# Patient Record
Sex: Male | Born: 1985 | Race: White | Hispanic: No | Marital: Married | Smoking: Never smoker
Health system: Southern US, Community
[De-identification: ages and names within clinical notes are randomized; demographics above are authoritative.]

## PROBLEM LIST (undated history)

## (undated) DIAGNOSIS — H547 Unspecified visual loss: Secondary | ICD-10-CM

## (undated) DIAGNOSIS — E785 Hyperlipidemia, unspecified: Secondary | ICD-10-CM

## (undated) DIAGNOSIS — T7840XA Allergy, unspecified, initial encounter: Secondary | ICD-10-CM

## (undated) DIAGNOSIS — J45909 Unspecified asthma, uncomplicated: Secondary | ICD-10-CM

## (undated) HISTORY — DX: Unspecified visual loss: H54.7

## (undated) HISTORY — PX: SHOULDER SURGERY: SHX246

## (undated) HISTORY — DX: Hyperlipidemia, unspecified: E78.5

## (undated) HISTORY — DX: Hemochromatosis, unspecified: E83.119

## (undated) HISTORY — DX: Unspecified asthma, uncomplicated: J45.909

## (undated) HISTORY — DX: Allergy, unspecified, initial encounter: T78.40XA

---

## 2011-12-02 ENCOUNTER — Encounter: Payer: Self-pay | Admitting: Medical

## 2011-12-02 ENCOUNTER — Ambulatory Visit (INDEPENDENT_AMBULATORY_CARE_PROVIDER_SITE_OTHER): Payer: BC Managed Care – PPO | Admitting: Medical

## 2011-12-02 VITALS — BP 110/78 | HR 60 | Temp 98.1°F | Resp 12 | Ht 72.0 in | Wt 257.0 lb

## 2011-12-02 DIAGNOSIS — Z139 Encounter for screening, unspecified: Secondary | ICD-10-CM

## 2011-12-02 DIAGNOSIS — Z111 Encounter for screening for respiratory tuberculosis: Secondary | ICD-10-CM

## 2011-12-02 DIAGNOSIS — Z0000 Encounter for general adult medical examination without abnormal findings: Secondary | ICD-10-CM

## 2011-12-02 LAB — LIPID PANEL
Cholesterol: 244 mg/dL — ABNORMAL HIGH (ref 0–200)
Total CHOL/HDL Ratio: 5.8 Ratio

## 2011-12-02 LAB — HEPATITIS B SURFACE ANTIBODY,QUALITATIVE

## 2011-12-02 LAB — CBC WITH DIFFERENTIAL/PLATELET
Basophils Relative: 0 % (ref 0–1)
Eosinophils Absolute: 0.1 10*3/uL (ref 0.0–0.7)
Eosinophils Relative: 2 % (ref 0–5)
Lymphs Abs: 1.9 10*3/uL (ref 0.7–4.0)
MCH: 30 pg (ref 26.0–34.0)
MCHC: 34.9 g/dL (ref 30.0–36.0)
MCV: 85.8 fL (ref 78.0–100.0)
Neutrophils Relative %: 54 % (ref 43–77)
Platelets: 239 10*3/uL (ref 150–400)
RBC: 5.07 MIL/uL (ref 4.22–5.81)
RDW: 13.9 % (ref 11.5–15.5)

## 2011-12-02 LAB — COMPREHENSIVE METABOLIC PANEL
ALT: 56 U/L — ABNORMAL HIGH (ref 0–53)
AST: 28 U/L (ref 0–37)
Albumin: 4.9 g/dL (ref 3.5–5.2)
Alkaline Phosphatase: 82 U/L (ref 39–117)
Potassium: 4.5 mEq/L (ref 3.5–5.3)
Sodium: 139 mEq/L (ref 135–145)
Total Bilirubin: 0.8 mg/dL (ref 0.3–1.2)
Total Protein: 7.1 g/dL (ref 6.0–8.3)

## 2011-12-02 LAB — TSH: TSH: 2.162 u[IU]/mL (ref 0.350–4.500)

## 2011-12-02 NOTE — Progress Notes (Signed)
Subjective:   HPI  Michael Lucero is a 26 y.o. male who presents for a complete physical.  New patient today.  Doing well, no particular c/o.  He is a Heritage manager at [employer] and needs form complete for physical.  Otherwise been in good health.    Reviewed their medical, surgical, family, social, medication, and allergy history and updated chart as appropriate.  Preventative care: Sees ophthalmology soon for routine f/u.  Has dentist visit coming up.   Last tdap 2008, has had Hep B series prior.     Past Medical History  Diagnosis Date  . Allergy   . Childhood asthma   . Vision problem     wears contacts    Past Surgical History  Procedure Date  . Shoulder surgery     x 3 surgeries, 2 for labral tear; right shoulder    Family History  Problem Relation Age of Onset  . Diabetes Father   . Heart disease Father     CABG age 63yo  . Cancer Father      neuroendocrine tumor of stomach  . Other Father     died post surgery with organ failure after tumor removal  . Stroke Neg Hx   . Hypertension Neg Hx   . Hyperlipidemia Neg Hx     History   Social History  . Marital Status: Single    Spouse Name: N/A    Number of Children: N/A  . Years of Education: N/A   Occupational History  . football coach [employer]   Social History Main Topics  . Smoking status: Never Smoker   . Smokeless tobacco: Not on file  . Alcohol Use: 7.2 oz/week    6 Cans of beer, 6 Glasses of wine per week  . Drug Use: No  . Sexually Active: Not on file   Other Topics Concern  . Not on file   Social History Narrative   Girlfriend, no children, exercise 4 times per week with running and weight lifting    No current outpatient prescriptions on file prior to visit.    No Known Allergies  Review of Systems Constitutional: -fever, -chills, -sweats, -unexpected weight change, -anorexia, -fatigue Allergy: -sneezing, -itching, -congestion Dermatology: denies changing moles,  rash, lumps, new worrisome lesions ENT: -runny nose, -ear pain, -sore throat, -hoarseness, -sinus pain, -teeth pain, -tinnitus, -hearing loss, -epistaxis Cardiology:  -chest pain, -palpitations, -edema, -orthopnea, -paroxysmal nocturnal dyspnea Respiratory: -cough, -shortness of breath, -dyspnea on exertion, -wheezing, -hemoptysis Gastroenterology: -abdominal pain, -nausea, -vomiting, -diarrhea, -constipation, -blood in stool, -changes in bowel movement, -dysphagia Hematology: -bleeding or bruising problems Musculoskeletal: -arthralgias, -myalgias, -joint swelling, -back pain, -neck pain, -cramping, -gait changes Ophthalmology: -vision changes, -eye redness, -itching, -discharge Urology: -dysuria, -difficulty urinating, -hematuria, -urinary frequency, -urgency, incontinence Neurology: -headache, -weakness, -tingling, -numbness, -speech abnormality, -memory loss, -falls, -dizziness Psychology:  -depressed mood, -agitation, -sleep problems      Objective:   Physical Exam  Filed Vitals:   12/02/11 1019  BP: 110/78  Pulse: 60  Temp: 98.1 F (36.7 C)  Resp: 12    General appearance: alert, no distress, WD/WN, stocky white male Skin: right upper latera thigh with horizontal scar, otherwise few scattered benign appearing macules HEENT: normocephalic, conjunctiva/corneas normal, sclerae anicteric, PERRLA, EOMi, nares patent, no discharge or erythema, pharynx normal Oral cavity: MMM, tongue normal, teeth normal Neck: supple, no lymphadenopathy, no thyromegaly, no masses, normal ROM Chest: non tender, normal shape and expansion Heart: RRR, normal S1, S2, no murmurs Lungs:  CTA bilaterally, no wheezes, rhonchi, or rales Abdomen: +bs, soft, non tender, non distended, no masses, no hepatomegaly, no splenomegaly, no bruits Back: non tender, normal ROM, no scoliosis Musculoskeletal: upper extremities non tender, no obvious deformity, normal ROM throughout, lower extremities non tender, no obvious  deformity, normal ROM throughout Extremities: no edema, no cyanosis, no clubbing Pulses: 2+ symmetric, upper and lower extremities, normal cap refill Neurological: alert, oriented x 3, CN2-12 intact, strength normal upper extremities and lower extremities, sensation normal throughout, DTRs 2+ throughout, no cerebellar signs, gait normal Psychiatric: normal affect, behavior normal, pleasant  GU: normal male external genitalia, nontender, no masses, no hernia, no lymphadenopathy Rectal: deferred   Assessment and Plan :    Encounter Diagnoses  Name Primary?  . Routine general medical examination at a health care facility Yes  . PPD screening test   . Screening for condition     Physical exam - discussed healthy lifestyle, diet, exercise, preventative care, vaccinations, and addressed their concerns.    PPD screening - due to the holiday weekend, he will return next week for PPD screening.  Titers today for Hep B and MMR, and he will get copies of his prior Tdap.   Form completion pending labs.    Follow-up pending labs.

## 2011-12-02 NOTE — Patient Instructions (Signed)
Preventative Care for Adults, Male       REGULAR HEALTH EXAMS:  A routine yearly physical is a good way to check in with your primary care provider about your health and preventive screening. It is also an opportunity to share updates about your health and any concerns you have, and receive a thorough all-over exam.   Most health insurance companies pay for at least some preventative services.  Check with your health plan for specific coverages.  WHAT PREVENTATIVE SERVICES DO MEN NEED?  Adult men should have their weight and blood pressure checked regularly.   Men age 27 and older should have their cholesterol levels checked regularly.  Beginning at age 27 and continuing to age 27, men should be screened for colorectal cancer.  Certain people should may need continued testing until age 85.  Other cancer screening may include exams for testicular and prostate cancer.  Updating vaccinations is part of preventative care.  Vaccinations help protect against diseases such as the flu.  Lab tests are generally done as part of preventative care to screen for anemia and blood disorders, to screen for problems with the kidneys and liver, to screen for bladder problems, to check blood sugar, and to check your cholesterol level.  Preventative services generally include counseling about diet, exercise, avoiding tobacco, drugs, excessive alcohol consumption, and sexually transmitted infections.    GENERAL RECOMMENDATIONS FOR GOOD HEALTH:  Healthy diet:  Eat a variety of foods, including fruit, vegetables, animal or vegetable protein, such as meat, fish, chicken, and eggs, or beans, lentils, tofu, and grains, such as rice.  Drink plenty of water daily.  Decrease saturated fat in the diet, avoid lots of red meat, processed foods, sweets, fast foods, and fried foods.  Exercise:  Aerobic exercise helps maintain good heart health. At least 30-40 minutes of moderate-intensity exercise is recommended.  For example, a brisk walk that increases your heart rate and breathing. This should be done on most days of the week.   Find a type of exercise or a variety of exercises that you enjoy so that it becomes a part of your daily life.  Examples are running, walking, swimming, water aerobics, and biking.  For motivation and support, explore group exercise such as aerobic class, spin class, Zumba, Yoga,or  martial arts, etc.    Set exercise goals for yourself, such as a certain weight goal, walk or run in a race such as a 5k walk/run.  Speak to your primary care provider about exercise goals.  Disease prevention:  If you smoke or chew tobacco, find out from your caregiver how to quit. It can literally save your life, no matter how long you have been a tobacco user. If you do not use tobacco, never begin.   Maintain a healthy diet and normal weight. Increased weight leads to problems with blood pressure and diabetes.   The Body Mass Index or BMI is a way of measuring how much of your body is fat. Having a BMI above 27 increases the risk of heart disease, diabetes, hypertension, stroke and other problems related to obesity. Your caregiver can help determine your BMI and based on it develop an exercise and dietary program to help you achieve or maintain this important measurement at a healthful level.  High blood pressure causes heart and blood vessel problems.  Persistent high blood pressure should be treated with medicine if weight loss and exercise do not work.   Fat and cholesterol leaves deposits in your arteries   that can block them. This causes heart disease and vessel disease elsewhere in your body.  If your cholesterol is found to be high, or if you have heart disease or certain other medical conditions, then you may need to have your cholesterol monitored frequently and be treated with medication.   Ask if you should have a stress test if your history suggests this. A stress test is a test done on  a treadmill that looks for heart disease. This test can find disease prior to there being a problem.  Avoid drinking alcohol in excess (more than two drinks per day).  Avoid use of street drugs. Do not share needles with anyone. Ask for professional help if you need assistance or instructions on stopping the use of alcohol, cigarettes, and/or drugs.  Brush your teeth twice a day with fluoride toothpaste, and floss once a day. Good oral hygiene prevents tooth decay and gum disease. The problems can be painful, unattractive, and can cause other health problems. Visit your dentist for a routine oral and dental check up and preventive care every 6-12 months.   Look at your skin regularly.  Use a mirror to look at your back. Notify your caregivers of changes in moles, especially if there are changes in shapes, colors, a size larger than a pencil eraser, an irregular border, or development of new moles.  Safety:  Use seatbelts 100% of the time, whether driving or as a passenger.  Use safety devices such as hearing protection if you work in environments with loud noise or significant background noise.  Use safety glasses when doing any work that could send debris in to the eyes.  Use a helmet if you ride a bike or motorcycle.  Use appropriate safety gear for contact sports.  Talk to your caregiver about gun safety.  Use sunscreen with a SPF (or skin protection factor) of 15 or greater.  Lighter skinned people are at a greater risk of skin cancer. Don't forget to also wear sunglasses in order to protect your eyes from too much damaging sunlight. Damaging sunlight can accelerate cataract formation.   Practice safe sex. Use condoms. Condoms are used for birth control and to help reduce the spread of sexually transmitted infections (or STIs).  Some of the STIs are gonorrhea (the clap), chlamydia, syphilis, trichomonas, herpes, HPV (human papilloma virus) and HIV (human immunodeficiency virus) which causes AIDS.  The herpes, HIV and HPV are viral illnesses that have no cure. These can result in disability, cancer and death.   Keep carbon monoxide and smoke detectors in your home functioning at all times. Change the batteries every 6 months or use a model that plugs into the wall.   Vaccinations:  Stay up to date with your tetanus shots and other required immunizations. You should have a booster for tetanus every 10 years. Be sure to get your flu shot every year, since 5%-20% of the [place] population comes down with the flu. The flu vaccine changes each year, so being vaccinated once is not enough. Get your shot in the fall, before the flu season peaks.   Other vaccines to consider:  Pneumococcal vaccine to protect against certain types of pneumonia.  This is normally recommended for adults age 65 or older.  However, adults younger than 27 years old with certain underlying conditions such as diabetes, heart or lung disease should also receive the vaccine.  Shingles vaccine to protect against Varicella Zoster if you are older than age 60, or younger   than 27 years old with certain underlying illness.  Hepatitis A vaccine to protect against a form of infection of the liver by a virus acquired from food.  Hepatitis B vaccine to protect against a form of infection of the liver by a virus acquired from blood or body fluids, particularly if you work in health care.  If you plan to travel internationally, check with your local health department for specific vaccination recommendations.  Cancer Screening:  Most routine colon cancer screening begins at the age of 50. On a yearly basis, doctors may provide special easy to use take-home tests to check for hidden blood in the stool. Sigmoidoscopy or colonoscopy can detect the earliest forms of colon cancer and is life saving. These tests use a small camera at the end of a tube to directly examine the colon. Speak to your caregiver about this at age 50, when routine  screening begins (and is repeated every 5 years unless early forms of pre-cancerous polyps or small growths are found).   At the age of 50 men usually start screening for prostate cancer every year. Screening may begin at a younger age for those with higher risk. Those at higher risk include African-Americans or having a family history of prostate cancer. There are two types of tests for prostate cancer:   Prostate-specific antigen (PSA) testing. Recent studies raise questions about prostate cancer using PSA and you should discuss this with your caregiver.   Digital rectal exam (in which your doctor's lubricated and gloved finger feels for enlargement of the prostate through the anus).   Screening for testicular cancer.  Do a monthly exam of your testicles. Gently roll each testicle between your thumb and fingers, feeling for any abnormal lumps. The best time to do this is after a hot shower or bath when the tissues are looser. Notify your caregivers of any lumps, tenderness or changes in size or shape immediately.     

## 2011-12-03 LAB — MEASLES/MUMPS/RUBELLA IMMUNITY
Rubella: 140.4 IU/mL — ABNORMAL HIGH
Rubeola IgG: 3.84 {ISR} — ABNORMAL HIGH

## 2011-12-08 ENCOUNTER — Other Ambulatory Visit (INDEPENDENT_AMBULATORY_CARE_PROVIDER_SITE_OTHER): Payer: BC Managed Care – PPO

## 2011-12-08 DIAGNOSIS — Z111 Encounter for screening for respiratory tuberculosis: Secondary | ICD-10-CM

## 2011-12-10 ENCOUNTER — Other Ambulatory Visit (INDEPENDENT_AMBULATORY_CARE_PROVIDER_SITE_OTHER): Payer: BC Managed Care – PPO

## 2011-12-10 DIAGNOSIS — Z23 Encounter for immunization: Secondary | ICD-10-CM

## 2011-12-10 DIAGNOSIS — Z0110 Encounter for examination of ears and hearing without abnormal findings: Secondary | ICD-10-CM

## 2011-12-10 NOTE — Progress Notes (Signed)
RIGHT EAR 25/500 25/1000 25/2000 25/4000 LEFT EAR 25/500 25/1000 25/2000 25/4000 HEARING TEST

## 2011-12-31 ENCOUNTER — Encounter: Payer: Self-pay | Admitting: Internal Medicine

## 2012-01-14 ENCOUNTER — Other Ambulatory Visit (INDEPENDENT_AMBULATORY_CARE_PROVIDER_SITE_OTHER): Payer: BC Managed Care – PPO

## 2012-01-14 DIAGNOSIS — Z23 Encounter for immunization: Secondary | ICD-10-CM

## 2012-05-23 ENCOUNTER — Other Ambulatory Visit (INDEPENDENT_AMBULATORY_CARE_PROVIDER_SITE_OTHER): Payer: BC Managed Care – PPO

## 2012-05-23 DIAGNOSIS — Z23 Encounter for immunization: Secondary | ICD-10-CM

## 2013-01-09 HISTORY — PX: WISDOM TOOTH EXTRACTION: SHX21

## 2013-01-19 ENCOUNTER — Encounter: Payer: Self-pay | Admitting: Medical

## 2013-01-19 ENCOUNTER — Ambulatory Visit (INDEPENDENT_AMBULATORY_CARE_PROVIDER_SITE_OTHER): Payer: BC Managed Care – PPO | Admitting: Medical

## 2013-01-19 VITALS — BP 112/80 | HR 68 | Temp 98.6°F | Resp 16 | Ht 71.2 in | Wt 262.0 lb

## 2013-01-19 DIAGNOSIS — E785 Hyperlipidemia, unspecified: Secondary | ICD-10-CM

## 2013-01-19 DIAGNOSIS — Z0000 Encounter for general adult medical examination without abnormal findings: Secondary | ICD-10-CM

## 2013-01-19 DIAGNOSIS — Q849 Congenital malformation of integument, unspecified: Secondary | ICD-10-CM

## 2013-01-19 DIAGNOSIS — Q829 Congenital malformation of skin, unspecified: Secondary | ICD-10-CM

## 2013-01-19 DIAGNOSIS — Z23 Encounter for immunization: Secondary | ICD-10-CM

## 2013-01-19 LAB — LIPID PANEL
Cholesterol: 267 mg/dL — ABNORMAL HIGH (ref 0–200)
HDL: 42 mg/dL (ref >39)
Total CHOL/HDL Ratio: 6.4 Ratio

## 2013-01-19 LAB — POCT URINALYSIS DIPSTICK
Bilirubin, UA: NEGATIVE
Blood, UA: NEGATIVE
Glucose, UA: NEGATIVE
Ketones, UA: NEGATIVE
Spec Grav, UA: <=1.005
pH, UA: 7

## 2013-01-19 LAB — CBC
HCT: 43.7 % (ref 39.0–52.0)
MCH: 30 pg (ref 26.0–34.0)
MCV: 85.2 fL (ref 78.0–100.0)
Platelets: 219 10*3/uL (ref 150–400)
RBC: 5.13 MIL/uL (ref 4.22–5.81)
WBC: 7.8 10*3/uL (ref 4.0–10.5)

## 2013-01-19 LAB — COMPREHENSIVE METABOLIC PANEL
AST: 25 U/L (ref 0–37)
Albumin: 4.8 g/dL (ref 3.5–5.2)
BUN: 12 mg/dL (ref 6–23)
CO2: 25 mEq/L (ref 19–32)
Calcium: 9.6 mg/dL (ref 8.4–10.5)
Chloride: 103 mEq/L (ref 96–112)
Creat: 1.17 mg/dL (ref 0.50–1.35)
Potassium: 4.4 mEq/L (ref 3.5–5.3)
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
## 2013-01-30 ENCOUNTER — Other Ambulatory Visit: Payer: Self-pay | Admitting: Medical

## 2013-01-30 MED ORDER — ATORVASTATIN CALCIUM 20 MG PO TABS: 20.0000 mg | ORAL_TABLET | Freq: Every day | ORAL | Status: DC

## 2013-02-01 ENCOUNTER — Encounter: Payer: Self-pay | Admitting: Medical

## 2013-02-08 ENCOUNTER — Telehealth: Payer: Self-pay | Admitting: Medical

## 2013-02-09 ENCOUNTER — Other Ambulatory Visit: Payer: Self-pay | Admitting: *Deleted

## 2013-02-09 ENCOUNTER — Telehealth: Payer: Self-pay | Admitting: *Deleted

## 2013-02-09 DIAGNOSIS — E7800 Pure hypercholesterolemia, unspecified: Secondary | ICD-10-CM

## 2013-02-09 DIAGNOSIS — Z79899 Other long term (current) drug therapy: Secondary | ICD-10-CM

## 2013-02-09 NOTE — Telephone Encounter (Signed)
Spoke with patient and he states that the brand name Lipitor will be $377. I asked about the generic, $10. He agreed to fill generically and take this medication. I scheduled him for 05/18/13 @ 9:15am for a fasting lab visit for liver/lipids. Future orders entered.

## 2013-02-09 NOTE — Telephone Encounter (Signed)
pls call pharmacy.  I meant this to be generic, I don't recall sending DAW, but pharmacy should have been able to dispense as generic.   Thus, I approve generic Lipitor.

## 2013-02-09 NOTE — Telephone Encounter (Signed)
Lipitor should be generic, how much was copay?  Statin drugs like Lipitor can sometimes cause muscle aches, but this doesn't happen very often.  I wouldn't worry about any other big side effects unless he has rash or other allergic reaction which aren't common.  We do have to monitor liver tests periodically with any cholesterol medication.    If lipitor very expensive, we can change to Pravachol.

## 2013-02-09 NOTE — Telephone Encounter (Signed)
He never took the rx to the pharmacy he looked up pricing online through his insurance carrier's website. You did write for the generic. He is going to take to pharmacy today and have filled.

## 2013-02-19 NOTE — Telephone Encounter (Signed)
TSD  

## 2013-05-17 ENCOUNTER — Other Ambulatory Visit: Payer: Self-pay

## 2013-05-18 ENCOUNTER — Other Ambulatory Visit: Payer: BC Managed Care – PPO

## 2013-05-18 DIAGNOSIS — Z79899 Other long term (current) drug therapy: Secondary | ICD-10-CM

## 2013-05-18 DIAGNOSIS — E7800 Pure hypercholesterolemia, unspecified: Secondary | ICD-10-CM

## 2013-05-18 LAB — HEPATIC FUNCTION PANEL
ALT: 40 U/L (ref 0–53)
Albumin: 4.3 g/dL (ref 3.5–5.2)
Total Protein: 6.6 g/dL (ref 6.0–8.3)

## 2013-05-18 LAB — LIPID PANEL
Cholesterol: 147 mg/dL (ref 0–200)
HDL: 39 mg/dL — ABNORMAL LOW (ref >39)
Total CHOL/HDL Ratio: 3.8 Ratio
Triglycerides: 110 mg/dL (ref <150)
VLDL: 22 mg/dL (ref 0–40)

## 2013-05-25 ENCOUNTER — Ambulatory Visit (INDEPENDENT_AMBULATORY_CARE_PROVIDER_SITE_OTHER): Payer: BC Managed Care – PPO | Admitting: Medical

## 2013-05-25 ENCOUNTER — Encounter: Payer: Self-pay | Admitting: Medical

## 2013-05-25 VITALS — BP 110/70 | HR 72 | Resp 16 | Wt 245.0 lb

## 2013-05-25 DIAGNOSIS — E7800 Pure hypercholesterolemia, unspecified: Secondary | ICD-10-CM

## 2013-05-25 MED ORDER — ATORVASTATIN CALCIUM 20 MG PO TABS: 20.0000 mg | ORAL_TABLET | Freq: Every day | ORAL | Status: DC

## 2013-05-25 NOTE — Progress Notes (Signed)
Subjective: Here for recheck.  Back at his physical in July he was found to have high cholesterol.  He started Lipitor in August, and came in last week for fasting lab recheck.  exercising 4-5 times per week with running.  Diet - mostly chicken, [place], occasional red meat 1-2 times per month.  Has a sweet tooth for cookies.  Drinks 3 glasses of red wine per week, beer 5-6 in one day.  girlfriend has celiac, so eaten mostly gluten free.   He does eat some whole grains, vegetables, fruits.  No other new c/o.  Had flu vaccine recently at employer.     Objective: Filed Vitals:   05/25/13 1039  BP: 110/70  Pulse: 72  Resp: 16    General appearance: alert, no distress, WD/WN Heart: RRR, normal S1, S2, no murmurs Lungs: CTA bilaterally, no wheezes, rhonchi, or rales Abdomen: +bs, soft, non tender, non distended, no masses, no hepatomegaly, no splenomegaly Pulses: 2+ symmetric, upper and lower extremities, normal cap refill   Assessment: Encounter Diagnosis  Name Primary?  . Pure hypercholesterolemia Yes    Plan: Reviewed his labs, much improved on Lipitor, c/t healthy diet, exercise, Lipitor 20mg  daily, f/u 7/21015, sooner prn.

## 2014-06-12 ENCOUNTER — Encounter: Payer: Self-pay | Admitting: Medical

## 2014-06-12 ENCOUNTER — Ambulatory Visit (INDEPENDENT_AMBULATORY_CARE_PROVIDER_SITE_OTHER): Payer: BC Managed Care – PPO | Admitting: Medical

## 2014-06-12 VITALS — BP 130/80 | HR 66 | Temp 98.1°F | Resp 15 | Ht 71.5 in | Wt 253.0 lb

## 2014-06-12 DIAGNOSIS — E785 Hyperlipidemia, unspecified: Secondary | ICD-10-CM

## 2014-06-12 DIAGNOSIS — Z0000 Encounter for general adult medical examination without abnormal findings: Secondary | ICD-10-CM

## 2014-06-12 DIAGNOSIS — Z8349 Family history of other endocrine, nutritional and metabolic diseases: Secondary | ICD-10-CM

## 2014-06-12 LAB — COMPREHENSIVE METABOLIC PANEL
ALT: 59 U/L — AB (ref 0–53)
AST: 34 U/L (ref 0–37)
Albumin: 4.6 g/dL (ref 3.5–5.2)
Alkaline Phosphatase: 73 U/L (ref 39–117)
BUN: 13 mg/dL (ref 6–23)
CALCIUM: 9.9 mg/dL (ref 8.4–10.5)
CHLORIDE: 103 meq/L (ref 96–112)
CO2: 27 meq/L (ref 19–32)
CREATININE: 1.07 mg/dL (ref 0.50–1.35)
GLUCOSE: 103 mg/dL — AB (ref 70–99)
Potassium: 4.5 mEq/L (ref 3.5–5.3)
Sodium: 139 mEq/L (ref 135–145)
TOTAL PROTEIN: 7.1 g/dL (ref 6.0–8.3)
Total Bilirubin: 1 mg/dL (ref 0.2–1.2)

## 2014-06-12 LAB — CBC
HCT: 44.8 % (ref 39.0–52.0)
Hemoglobin: 16.2 g/dL (ref 13.0–17.0)
MCH: 30.3 pg (ref 26.0–34.0)
MCHC: 36.2 g/dL — ABNORMAL HIGH (ref 30.0–36.0)
MCV: 83.7 fL (ref 78.0–100.0)
MPV: 9.5 fL (ref 9.4–12.4)
Platelets: 220 K/uL (ref 150–400)
RBC: 5.35 MIL/uL (ref 4.22–5.81)
RDW: 13.9 % (ref 11.5–15.5)
WBC: 5.4 K/uL (ref 4.0–10.5)

## 2014-06-12 LAB — IRON AND TIBC
%SAT: 69 % — AB (ref 20–55)
IRON: 216 ug/dL — AB (ref 42–165)
TIBC: 314 ug/dL (ref 215–435)
UIBC: 98 ug/dL — ABNORMAL LOW (ref 125–400)

## 2014-06-12 LAB — POCT URINALYSIS DIPSTICK
BILIRUBIN UA: NEGATIVE
GLUCOSE UA: NEGATIVE
KETONES UA: NEGATIVE
Leukocytes, UA: NEGATIVE
NITRITE UA: NEGATIVE
PH UA: 6
Protein, UA: NEGATIVE
RBC UA: NEGATIVE
SPEC GRAV UA: 1.02
Urobilinogen, UA: NEGATIVE

## 2014-06-12 LAB — LIPID PANEL
Cholesterol: 193 mg/dL (ref 0–200)
HDL: 52 mg/dL
LDL Cholesterol: 111 mg/dL — ABNORMAL HIGH (ref 0–99)
Total CHOL/HDL Ratio: 3.7 ratio
Triglycerides: 150 mg/dL — ABNORMAL HIGH
VLDL: 30 mg/dL (ref 0–40)

## 2014-06-12 LAB — FERRITIN: FERRITIN: 774 ng/mL — AB (ref 22–322)

## 2014-06-12 NOTE — Progress Notes (Signed)
Subjective:   HPI  Michael DryKevin Lucero is a 28 y.o. male who presents for a complete physical.   Preventative care:here Last ophthalmology visit: 2015 Dr. Laural BenesJohnson Last [facility]: [facility] Last colonoscopy:no Last prostate exam: no Last EKG:as a child Last labs: today  Prior vaccinations: TD or Tdap:current Influenza:2015 Pneumococcal:no Shingles/Zostavax:no  Concerns: Complaint with cholesterol medication.    New family history - mother diagnosed with hemochromatosis  Reviewed their medical, surgical, family, social, medication, and allergy history and updated chart as appropriate.  Past Medical History  Diagnosis Date  . Allergy   . Childhood asthma   . Vision problem     wears contacts  . Hyperlipidemia     Past Surgical History  Procedure Laterality Date  . Shoulder surgery      x 3 surgeries, 2 for labral tear; right shoulder  . Wisdom tooth extraction  01/2013    History   Social History  . Marital Status: Single    Spouse Name: N/A    Number of Children: N/A  . Years of Education: N/A   Occupational History  . football coach [employer]   Social History Main Topics  . Smoking status: Never Smoker   . Smokeless tobacco: Not on file  . Alcohol Use: 6.0 oz/week    5 Glasses of wine, 5 Cans of beer per week  . Drug Use: No  . Sexual Activity: Not on file   Other Topics Concern  . Not on file   Social History Narrative   Girlfriend x 4 years, no children, exercise 4 times per week with running and weight lifting, Heritage managerootball Coach, Publishing rights managerteaching biology and anatomy, [employer].  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  Girlfriend and he just bought house 05/2014.    Family History  Problem Relation Age of Onset  . Diabetes Father   . Heart disease Father     CABG age 29yo  . Cancer Father      neuroendocrine tumor of stomach  . Other Father     died post surgery with organ failure after tumor removal  . Stroke Neg Hx    . Hypertension Neg Hx   . Hyperlipidemia Neg Hx   . Hemochromatosis Mother     [id number] gene    Current outpatient prescriptions: atorvastatin (LIPITOR) 20 MG tablet, Take 1 tablet (20 mg total) by mouth daily., Disp: 90 tablet, Rfl: 1;  ibuprofen (ADVIL,MOTRIN) 400 MG tablet, Take 400 mg by mouth every 6 (six) hours as needed for pain., Disp: , Rfl:   No Known Allergies   Review of Systems Constitutional: -fever, -chills, -sweats, -unexpected weight change, -decreased appetite, -fatigue Allergy: -sneezing, -itching, -congestion Dermatology: -changing moles, --rash, -lumps ENT: -runny nose, -ear pain, -sore throat, -hoarseness, -sinus pain, -teeth pain, - ringing in ears, -hearing loss, -nosebleeds Cardiology: -chest pain, -palpitations, -swelling, -difficulty breathing when lying flat, -waking up short of breath Respiratory: -cough, -shortness of breath, -difficulty breathing with exercise or exertion, -wheezing, -coughing up blood Gastroenterology: -abdominal pain, -nausea, -vomiting, -diarrhea, -constipation, -blood in stool, -changes in bowel movement, -difficulty swallowing or eating Hematology: -bleeding, -bruising  Musculoskeletal: -joint aches, -muscle aches, -joint swelling, -back pain, -neck pain, -cramping, -changes in gait Ophthalmology: denies vision changes, eye redness, itching, discharge Urology: -burning with urination, -difficulty urinating, -blood in urine, -urinary frequency, -urgency, -incontinence Neurology: -headache, -weakness, -tingling, -numbness, -memory loss, -falls, -dizziness Psychology: -depressed mood, -agitation, -sleep problems     Objective:   Physical Exam  BP 130/80 mmHg  Pulse 66  Temp(Src) 98.1 F (36.7 C) (Oral)  Resp 15  Ht 5' 11.5" (1.816 m)  Wt 253 lb (114.76 kg)  BMI 34.80 kg/m2  General appearance: alert, no distress, WD/WN, stocky white male  Skin: right anterolateral shoulder with surgical scar, otherwise few scattered benign  appearing macules, right posterolateral upper chest just distal to axilla with triangular brown red macule, no worrisome lesions HEENT: normocephalic, conjunctiva/corneas normal, sclerae anicteric, PERRLA, EOMi, nares patent, no discharge or erythema, pharynx normal  Oral cavity: MMM, tongue normal, teeth in good repair  Neck: supple, no lymphadenopathy, no thyromegaly, no masses, normal ROM, no bruits Chest: non tender, normal shape and expansion  Heart: RRR, normal S1, S2, no murmurs  Lungs: CTA bilaterally, no wheezes, rhonchi, or rales  Abdomen: +bs, soft, non tender, non distended, no masses, no hepatomegaly, no splenomegaly, no bruits  Back: non tender, normal ROM, no scoliosis  Musculoskeletal: upper extremities non tender, no obvious deformity, normal ROM throughout, lower extremities non tender, no obvious deformity, normal ROM throughout  Extremities: no edema, no cyanosis, no clubbing  Pulses: 2+ symmetric, upper and lower extremities, normal cap refill  Neurological: alert, oriented x 3, CN2-12 intact, strength normal upper extremities and lower extremities, sensation normal throughout, DTRs 2+ throughout, no cerebellar signs, gait normal  Psychiatric: normal affect, behavior normal, pleasant  GU: normal male external genitalia, circumcised, nontender, no masses, no hernia, no lymphadenopathy  Rectal: deferred  Assessment and Plan :   Encounter Diagnoses  Name Primary?  . Encounter for health maintenance examination in adult Yes  . Dyslipidemia   . Family history of hemochromatosis     Physical exam - discussed healthy lifestyle, diet, exercise, preventative care, vaccinations, and addressed their concerns.   See your eye doctor yearly for routine vision care. See your dentist yearly for routine dental care including hygiene visits twice yearly. Dyslipidemia - compliant with medication, labs today Family history of hemochromatosis - lab screening  today Follow-up pending labs

## 2014-06-13 ENCOUNTER — Other Ambulatory Visit: Payer: Self-pay | Admitting: Medical

## 2014-06-13 MED ORDER — ATORVASTATIN CALCIUM 20 MG PO TABS: 20.0000 mg | ORAL_TABLET | Freq: Every day | ORAL | Status: DC

## 2014-06-17 ENCOUNTER — Other Ambulatory Visit: Payer: Self-pay | Admitting: Family Medicine

## 2014-06-17 DIAGNOSIS — Z8342 Family history of familial hypercholesterolemia: Secondary | ICD-10-CM

## 2014-07-03 ENCOUNTER — Other Ambulatory Visit: Payer: Self-pay | Admitting: Medical

## 2014-07-12 HISTORY — DX: Hemochromatosis, unspecified: E83.119

## 2014-08-05 ENCOUNTER — Telehealth: Payer: Self-pay | Admitting: Hematology

## 2014-08-05 NOTE — Telephone Encounter (Signed)
S/W DR. HUNG AND GAVE NP APPT FOR 02/10 @ 1:30 W/DR.FENG REFERRING DR. PATRICK HUNG DX- POSITIVE HEMOCHROMATOSIS

## 2014-08-21 ENCOUNTER — Ambulatory Visit (HOSPITAL_BASED_OUTPATIENT_CLINIC_OR_DEPARTMENT_OTHER): Payer: Self-pay

## 2014-08-21 ENCOUNTER — Encounter: Payer: Self-pay | Admitting: Hematology

## 2014-08-21 ENCOUNTER — Telehealth: Payer: Self-pay | Admitting: Hematology

## 2014-08-21 ENCOUNTER — Ambulatory Visit: Payer: BC Managed Care – PPO

## 2014-08-21 ENCOUNTER — Ambulatory Visit (HOSPITAL_BASED_OUTPATIENT_CLINIC_OR_DEPARTMENT_OTHER): Payer: BC Managed Care – PPO | Admitting: Hematology

## 2014-08-21 LAB — COMPREHENSIVE METABOLIC PANEL (CC13)
ALBUMIN: 4.6 g/dL (ref 3.5–5.0)
ALT: 22 U/L (ref 0–55)
ANION GAP: 12 meq/L — AB (ref 3–11)
AST: 20 U/L (ref 5–34)
Alkaline Phosphatase: 72 U/L (ref 40–150)
BUN: 14.2 mg/dL (ref 7.0–26.0)
CALCIUM: 9.7 mg/dL (ref 8.4–10.4)
CO2: 25 mEq/L (ref 22–29)
Chloride: 105 mEq/L (ref 98–109)
Creatinine: 1.3 mg/dL (ref 0.7–1.3)
EGFR: 75 mL/min/{1.73_m2} — ABNORMAL LOW (ref >90)
GLUCOSE: 91 mg/dL (ref 70–140)
POTASSIUM: 3.9 meq/L (ref 3.5–5.1)
Sodium: 142 mEq/L (ref 136–145)
TOTAL PROTEIN: 7.3 g/dL (ref 6.4–8.3)
Total Bilirubin: 0.69 mg/dL (ref 0.20–1.20)

## 2014-08-21 LAB — CBC & DIFF AND RETIC
BASO%: 0.3 % (ref 0.0–2.0)
Basophils Absolute: 0 10*3/uL (ref 0.0–0.1)
EOS%: 3 % (ref 0.0–7.0)
Eosinophils Absolute: 0.2 10*3/uL (ref 0.0–0.5)
HCT: 42.6 % (ref 38.4–49.9)
HGB: 15.4 g/dL (ref 13.0–17.1)
Immature Retic Fract: 1 % — ABNORMAL LOW (ref 3.00–10.60)
LYMPH%: 33.3 % (ref 14.0–49.0)
MCH: 30.6 pg (ref 27.2–33.4)
MCHC: 36.2 g/dL — ABNORMAL HIGH (ref 32.0–36.0)
MCV: 84.5 fL (ref 79.3–98.0)
MONO#: 0.5 10*3/uL (ref 0.1–0.9)
MONO%: 7.1 % (ref 0.0–14.0)
NEUT#: 3.6 10*3/uL (ref 1.5–6.5)
NEUT%: 56.3 % (ref 39.0–75.0)
Platelets: 202 10*3/uL (ref 140–400)
RBC: 5.04 10*6/uL (ref 4.20–5.82)
RDW: 12.2 % (ref 11.0–14.6)
Retic %: 2.39 % — ABNORMAL HIGH (ref 0.80–1.80)
Retic Ct Abs: 120.46 10*3/uL — ABNORMAL HIGH (ref 34.80–93.90)
WBC: 6.3 10*3/uL (ref 4.0–10.3)
lymph#: 2.1 10*3/uL (ref 0.9–3.3)

## 2014-08-21 NOTE — Progress Notes (Signed)
Checked in new pt with no financial concerns.  Pt states he's here for a hematology concern so financial assistance may not be needed but he has my card for any billing questions or concerns.

## 2014-08-21 NOTE — Progress Notes (Signed)
[facility]  [phone] [phone]  Clinic New Consult Note   Patient Care Team: Ronnald Nian, MD as PCP - General (Family Medicine) 08/21/2014  CHIEF COMPLAINTS/PURPOSE OF CONSULTATION:  Hereditary Hemachromatosis  Referring physician: Dr. Jeani Hawking  HISTORY OF PRESENTING ILLNESS:  Michael Lucero 29 y.o. male is here because of newly diagnosed hereditary hemochromatosis.  He has strong family history of hemochromatosis, with his maternal grandmother father, maternal uncle and his mother who was recently diagnosed with hemochromatosis. His father died of neuroendocrine tumor of stomach, but he was also diagnosed of his coronary artery disease, type 2 diabetes and a thyroidectomy. He was recently tested for HFE gene and was found to have C282Y/C282Y mutation. His lab showed fasting blood glucose 103, ferritin 774, iron saturation 69%, hemoglobin 16, AST 34, ALT 59.    He feels well overall, and has no complaints. 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 He exercise 4 times a week and has a very physical active lifestyle. He denies any chest pain, shortness breath on exertion, abdominal discomfort, or any recent weight change.  MEDICAL HISTORY:  Past Medical History  Diagnosis Date  . Allergy   . Childhood asthma   . Vision problem     wears contacts  . Hyperlipidemia     SURGICAL HISTORY: Past Surgical History  Procedure Laterality Date  . Shoulder surgery      x 3 surgeries, 2 for labral tear; right shoulder  . Wisdom tooth extraction  01/2013    SOCIAL HISTORY: History   Social History  . Marital Status: Single    Spouse Name: N/A  . Number of Children: N/A  . Years of Education: N/A   Occupational History  . football coach [employer]   Social History Main Topics  . Smoking status: Never Smoker   . Smokeless tobacco: Not on file  . Alcohol Use: 6.0 oz/week    5  Glasses of wine, 5 Cans of beer per week  . Drug Use: No  . Sexual Activity: Not on file   Other Topics Concern  . Not on file   Social History Narrative   Girlfriend x 4 years, no children, exercise 4 times per week with running and weight lifting, Heritage manager, Publishing rights manager and anatomy, [employer].  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  Girlfriend and he just bought house 05/2014.    FAMILY HISTORY: Family History  Problem Relation Age of Onset  . Diabetes Father   . Heart disease Father     CABG age 48yo  . Cancer Father      neuroendocrine tumor of stomach  . Other Father     died post surgery with organ failure after tumor removal  . Stroke Neg Hx   . Hypertension Neg Hx   . Hyperlipidemia Neg Hx   . Hemochromatosis Mother     [id number] gene    ALLERGIES:  has No Known Allergies.  MEDICATIONS:  Current Outpatient Prescriptions  Medication Sig Dispense Refill  . atorvastatin (LIPITOR) 20 MG tablet TAKE 1 TABLET BY MOUTH DAILY 90 tablet 0  . ibuprofen (ADVIL,MOTRIN) 400 MG tablet Take 400 mg by mouth every 6 (six) hours as needed for pain.     No current facility-administered medications for this visit.    REVIEW OF SYSTEMS:   Constitutional: Denies fevers, chills or abnormal night sweats Eyes: Denies blurriness of vision, double vision  or watery eyes Ears, nose, mouth, throat, and face: Denies mucositis or sore throat Respiratory: Denies cough, dyspnea or wheezes Cardiovascular: Denies palpitation, chest discomfort or lower extremity swelling Gastrointestinal:  Denies nausea, heartburn or change in bowel habits Skin: Denies abnormal skin rashes Lymphatics: Denies new lymphadenopathy or easy bruising Neurological:Denies numbness, tingling or new weaknesses Behavioral/Psych: Mood is stable, no new changes  All other systems were reviewed with the patient and are negative.  PHYSICAL EXAMINATION: ECOG PERFORMANCE STATUS: 0 -  Asymptomatic  Filed Vitals:   08/21/14 1316  BP: 124/71  Pulse: 61  Temp: 98.2 F (36.8 C)  Resp: 20   Filed Weights   08/21/14 1316  Weight: 250 lb 3.2 oz (113.49 kg)    GENERAL:alert, no distress and comfortable SKIN: skin color, texture, turgor are normal, no rashes or significant lesions EYES: normal, conjunctiva are pink and non-injected, sclera clear OROPHARYNX:no exudate, no erythema and lips, buccal mucosa, and tongue normal  NECK: supple, thyroid normal size, non-tender, without nodularity LYMPH:  no palpable lymphadenopathy in the cervical, axillary or inguinal LUNGS: clear to auscultation and percussion with normal breathing effort HEART: regular rate & rhythm and no murmurs and no lower extremity edema ABDOMEN:abdomen soft, non-tender and normal bowel sounds Musculoskeletal:no cyanosis of digits and no clubbing  PSYCH: alert & oriented x 3 with fluent speech NEURO: no focal motor/sensory deficits  LABORATORY DATA:  I have reviewed the data as listed Lab Results  Component Value Date   WBC 5.4 06/12/2014   HGB 16.2 06/12/2014   HCT 44.8 06/12/2014   MCV 83.7 06/12/2014   PLT 220 06/12/2014    Recent Labs  06/12/14 0853  NA 139  K 4.5  CL 103  CO2 27  GLUCOSE 103*  BUN 13  CREATININE 1.07  CALCIUM 9.9  PROT 7.1  ALBUMIN 4.6  AST 34  ALT 59*  ALKPHOS 73  BILITOT 1.0    RADIOGRAPHIC STUDIES: I have personally reviewed the radiological images as listed and agreed with the findings in the report. No results found.  ASSESSMENT & PLAN:  29 year old Caucasian male  1. 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 He was tested positive for HFE mutation. His ferritin level is 774. -We discussed the natural history of hemochromatosis, complications without treatments, including but not limited to, liver failure and cirrhosis, diabetes,  congestive heart failure, hypothyroidism, hypopituitarism, etc. He has slightly elevated liver enzyme and fasting glucose. -I'll obtain a abdominal ultrasound to evaluate his liver and spleen, echocardiogram to evaluate his heart, and HbA1c, TSH. -We discussed the treatment options for hemochromatosis, mainly therapeutic phlebotomy, with a goal of keeping ferritin below 50. He agrees with the plan, we'll start the first phlebotomy today. -We also discussed other strategies to decrease iron absorption, such as avoiding alcohol drinking, avoid vitamin C, no supplements containing iron, healthy diet and a regular exercise. -We'll also discussed screening hemochromatosis in his children in the future -I offered written material about hemochromatosis  Plan: 1- Lab today -Echo, ultrasound of the abdomen -Phlebotomy every 2 weeks, if hemoglobin above 13, and a ferritin above 50 -Return to clinic in 2 months   All questions were answered. The patient knows to call the clinic with any problems, questions or concerns. I spent 40 minutes counseling the patient face to face. The total time spent in the appointment was 45 minutes and more than 50% was on counseling.  Malachy MoodFeng, Shaneta Cervenka, MD 08/21/2014 1:30 PM

## 2014-08-21 NOTE — Progress Notes (Signed)
Hct 42.6 Date 08/21/14 Ferritin 774 Date 06/12/14

## 2014-08-21 NOTE — Patient Instructions (Signed)

## 2014-08-21 NOTE — Telephone Encounter (Signed)
gv adn rpitned appt sched adn avs for pt for Feb thru April...s.ed added tx.

## 2014-08-22 ENCOUNTER — Encounter: Payer: Self-pay | Admitting: Hematology

## 2014-08-22 ENCOUNTER — Telehealth: Payer: Self-pay | Admitting: Hematology

## 2014-08-22 LAB — IRON AND TIBC CHCC
%SAT: 42 % (ref 20–55)
Iron: 113 ug/dL (ref 42–163)
TIBC: 265 ug/dL (ref 202–409)
UIBC: 153 ug/dL (ref 117–376)

## 2014-08-22 LAB — AFP TUMOR MARKER: AFP TUMOR MARKER: 2.2 ng/mL (ref <6.1)

## 2014-08-22 LAB — AFP TUMOR MARKER-PREVIOUS METHOD: AFP Tumor Marker: 1.8 ng/mL (ref 0.0–8.0)

## 2014-08-22 LAB — TSH CHCC: TSH: 1.308 m(IU)/L (ref 0.320–4.118)

## 2014-08-22 LAB — T4, FREE: FREE T4: 1.1 ng/dL (ref 0.80–1.80)

## 2014-08-22 LAB — HEMOGLOBIN A1C
HEMOGLOBIN A1C: 5 % (ref <5.7)
Mean Plasma Glucose: 97 mg/dL (ref <117)

## 2014-08-22 LAB — FERRITIN CHCC: FERRITIN: 245 ng/mL (ref 22–316)

## 2014-08-22 NOTE — Telephone Encounter (Signed)
s..w pt and advised on echo appt....pt ok adn aware.Marland Kitchen.Marland Kitchen.pre Berkley Harveyauth [phone]

## 2014-08-24 ENCOUNTER — Encounter: Payer: Self-pay | Admitting: Hematology

## 2014-08-28 ENCOUNTER — Other Ambulatory Visit (HOSPITAL_COMMUNITY): Payer: BC Managed Care – PPO

## 2014-09-02 ENCOUNTER — Ambulatory Visit (HOSPITAL_COMMUNITY)
Admission: RE | Admit: 2014-09-02 | Discharge: 2014-09-02 | Disposition: A | Discharge status: Home / Self Care | Payer: BC Managed Care – PPO | Source: Ambulatory Visit | Attending: Hematology | Admitting: Hematology

## 2014-09-02 DIAGNOSIS — R932 Abnormal findings on diagnostic imaging of liver and biliary tract: Secondary | ICD-10-CM | POA: Diagnosis not present

## 2014-09-04 ENCOUNTER — Other Ambulatory Visit (HOSPITAL_BASED_OUTPATIENT_CLINIC_OR_DEPARTMENT_OTHER): Payer: BC Managed Care – PPO

## 2014-09-04 ENCOUNTER — Ambulatory Visit (HOSPITAL_COMMUNITY)
Admission: RE | Admit: 2014-09-04 | Discharge: 2014-09-04 | Disposition: A | Discharge status: Home / Self Care | Payer: BC Managed Care – PPO | Source: Ambulatory Visit | Attending: Hematology | Admitting: Hematology

## 2014-09-04 ENCOUNTER — Ambulatory Visit (HOSPITAL_BASED_OUTPATIENT_CLINIC_OR_DEPARTMENT_OTHER): Payer: BC Managed Care – PPO

## 2014-09-04 DIAGNOSIS — E782 Mixed hyperlipidemia: Secondary | ICD-10-CM

## 2014-09-04 DIAGNOSIS — I517 Cardiomegaly: Secondary | ICD-10-CM

## 2014-09-04 LAB — CBC WITH DIFFERENTIAL/PLATELET
BASO%: 0.7 % (ref 0.0–2.0)
Basophils Absolute: 0 10*3/uL (ref 0.0–0.1)
EOS%: 2.5 % (ref 0.0–7.0)
Eosinophils Absolute: 0.1 10*3/uL (ref 0.0–0.5)
HEMATOCRIT: 43.4 % (ref 38.4–49.9)
HGB: 14.8 g/dL (ref 13.0–17.1)
LYMPH#: 1.7 10*3/uL (ref 0.9–3.3)
LYMPH%: 34.5 % (ref 14.0–49.0)
MCH: 29.5 pg (ref 27.2–33.4)
MCHC: 34.1 g/dL (ref 32.0–36.0)
MCV: 86.7 fL (ref 79.3–98.0)
MONO#: 0.4 10*3/uL (ref 0.1–0.9)
MONO%: 7.7 % (ref 0.0–14.0)
NEUT#: 2.7 10*3/uL (ref 1.5–6.5)
NEUT%: 54.6 % (ref 39.0–75.0)
PLATELETS: 186 10*3/uL (ref 140–400)
RBC: 5.01 10*6/uL (ref 4.20–5.82)
RDW: 13.1 % (ref 11.0–14.6)
WBC: 5 10*3/uL (ref 4.0–10.3)

## 2014-09-04 LAB — FERRITIN CHCC: Ferritin: 176 ng/ml (ref 22–316)

## 2014-09-04 NOTE — Progress Notes (Signed)
Therapeutic phlebotomy performed via right AC over approximately 5 minutes.  550gm removed using 16 gauge phlebotomy kit.  Pt tolerated well.  Pt observed for 30 minutes post procedure.  Drink provided.  VSS.

## 2014-09-04 NOTE — Progress Notes (Signed)
  Echocardiogram 2D Echocardiogram has been performed.  Arvil ChacoFoster, Alizon Schmeling 09/04/2014, 3:39 PM

## 2014-09-04 NOTE — Patient Instructions (Signed)

## 2014-09-18 ENCOUNTER — Ambulatory Visit (HOSPITAL_BASED_OUTPATIENT_CLINIC_OR_DEPARTMENT_OTHER): Payer: BC Managed Care – PPO

## 2014-09-18 ENCOUNTER — Other Ambulatory Visit (HOSPITAL_BASED_OUTPATIENT_CLINIC_OR_DEPARTMENT_OTHER): Payer: BC Managed Care – PPO

## 2014-09-18 LAB — CBC WITH DIFFERENTIAL/PLATELET
BASO%: 0.6 % (ref 0.0–2.0)
Basophils Absolute: 0 10*3/uL (ref 0.0–0.1)
EOS%: 2.4 % (ref 0.0–7.0)
Eosinophils Absolute: 0.1 10*3/uL (ref 0.0–0.5)
HCT: 43.6 % (ref 38.4–49.9)
HGB: 14.9 g/dL (ref 13.0–17.1)
LYMPH%: 30.3 % (ref 14.0–49.0)
MCH: 30 pg (ref 27.2–33.4)
MCHC: 34.1 g/dL (ref 32.0–36.0)
MCV: 87.9 fL (ref 79.3–98.0)
MONO#: 0.3 10*3/uL (ref 0.1–0.9)
MONO%: 6.2 % (ref 0.0–14.0)
NEUT%: 60.5 % (ref 39.0–75.0)
NEUTROS ABS: 2.9 10*3/uL (ref 1.5–6.5)
PLATELETS: 202 10*3/uL (ref 140–400)
RBC: 4.96 10*6/uL (ref 4.20–5.82)
RDW: 13 % (ref 11.0–14.6)
WBC: 4.9 10*3/uL (ref 4.0–10.3)
lymph#: 1.5 10*3/uL (ref 0.9–3.3)

## 2014-09-18 LAB — FERRITIN CHCC: Ferritin: 125 ng/ml (ref 22–316)

## 2014-09-18 NOTE — Patient Instructions (Signed)

## 2014-09-18 NOTE — Progress Notes (Signed)
500 g removed using a 16 gauge phlebotomy set over 10 minutes. Patient tolerated well, nourishment provided.

## 2014-10-02 ENCOUNTER — Ambulatory Visit (HOSPITAL_BASED_OUTPATIENT_CLINIC_OR_DEPARTMENT_OTHER): Payer: BC Managed Care – PPO

## 2014-10-02 ENCOUNTER — Other Ambulatory Visit (HOSPITAL_BASED_OUTPATIENT_CLINIC_OR_DEPARTMENT_OTHER): Payer: BC Managed Care – PPO

## 2014-10-02 LAB — CBC WITH DIFFERENTIAL/PLATELET
BASO%: 0.7 % (ref 0.0–2.0)
Basophils Absolute: 0 10*3/uL (ref 0.0–0.1)
EOS%: 3.2 % (ref 0.0–7.0)
Eosinophils Absolute: 0.2 10*3/uL (ref 0.0–0.5)
HCT: 45 % (ref 38.4–49.9)
HGB: 15.6 g/dL (ref 13.0–17.1)
LYMPH#: 1.4 10*3/uL (ref 0.9–3.3)
LYMPH%: 28.5 % (ref 14.0–49.0)
MCH: 30.1 pg (ref 27.2–33.4)
MCHC: 34.7 g/dL (ref 32.0–36.0)
MCV: 86.8 fL (ref 79.3–98.0)
MONO#: 0.4 10*3/uL (ref 0.1–0.9)
MONO%: 7.7 % (ref 0.0–14.0)
NEUT%: 59.9 % (ref 39.0–75.0)
NEUTROS ABS: 2.9 10*3/uL (ref 1.5–6.5)
Platelets: 204 10*3/uL (ref 140–400)
RBC: 5.18 10*6/uL (ref 4.20–5.82)
RDW: 12.9 % (ref 11.0–14.6)
WBC: 4.9 10*3/uL (ref 4.0–10.3)

## 2014-10-02 LAB — FERRITIN CHCC: FERRITIN: 101 ng/mL (ref 22–316)

## 2014-10-02 NOTE — Patient Instructions (Signed)
Blood Transfusion Information WHAT IS A BLOOD TRANSFUSION? A transfusion is the replacement of blood or some of its parts. Blood is made up of multiple cells which provide different functions.  Red blood cells carry oxygen and are used for blood loss replacement.  White blood cells fight against infection.  Platelets control bleeding.  Plasma helps clot blood.  Other blood products are available for specialized needs, such as hemophilia or other clotting disorders. BEFORE THE TRANSFUSION  Who gives blood for transfusions?   You may be able to donate blood to be used at a later date on yourself (autologous donation).  Relatives can be asked to donate blood. This is generally not any safer than if you have received blood from a stranger. The same precautions are taken to ensure safety when a relative's blood is donated.  Healthy volunteers who are fully evaluated to make sure their blood is safe. This is blood bank blood. Transfusion therapy is the safest it has ever been in the practice of medicine. Before blood is taken from a donor, a complete history is taken to make sure that person has no history of diseases nor engages in risky social behavior (examples are intravenous drug use or sexual activity with multiple partners). The donor's travel history is screened to minimize risk of transmitting infections, such as malaria. The donated blood is tested for signs of infectious diseases, such as HIV and hepatitis. The blood is then tested to be sure it is compatible with you in order to minimize the chance of a transfusion reaction. If you or a relative donates blood, this is often done in anticipation of surgery and is not appropriate for emergency situations. It takes many days to process the donated blood. RISKS AND COMPLICATIONS Although transfusion therapy is very safe and saves many lives, the main dangers of transfusion include:   Getting an infectious disease.  Developing a  transfusion reaction. This is an allergic reaction to something in the blood you were given. Every precaution is taken to prevent this. The decision to have a blood transfusion has been considered carefully by your caregiver before blood is given. Blood is not given unless the benefits outweigh the risks. AFTER THE TRANSFUSION  Right after receiving a blood transfusion, you will usually feel much better and more energetic. This is especially true if your red blood cells have gotten low (anemic). The transfusion raises the level of the red blood cells which carry oxygen, and this usually causes an energy increase.  The nurse administering the transfusion will monitor you carefully for complications. HOME CARE INSTRUCTIONS  No special instructions are needed after a transfusion. You may find your energy is better. Speak with your caregiver about any limitations on activity for underlying diseases you may have. SEEK MEDICAL CARE IF:   Your condition is not improving after your transfusion.  You develop redness or irritation at the intravenous (IV) site. SEEK IMMEDIATE MEDICAL CARE IF:  Any of the following symptoms occur over the next 12 hours:  Shaking chills.  You have a temperature by mouth above 102 F (38.9 C), not controlled by medicine.  Chest, back, or muscle pain.  People around you feel you are not acting correctly or are confused.  Shortness of breath or difficulty breathing.  Dizziness and fainting.  You get a rash or develop hives.  You have a decrease in urine output.  Your urine turns a dark color or changes to pink, red, or brown. Any of the following   symptoms occur over the next 10 days:  You have a temperature by mouth above 102 F (38.9 C), not controlled by medicine.  Shortness of breath.  Weakness after normal activity.  The white part of the eye turns yellow (jaundice).  You have a decrease in the amount of urine or are urinating less often.  Your  urine turns a dark color or changes to pink, red, or brown. Document Released: 06/25/2000 Document Revised: 09/20/2011 Document Reviewed: 02/12/2008 ExitCare Patient Information 2015 [employer]. This information is not intended to replace advice given to you by your health care provider. Make sure you discuss any questions you have with your health care provider. Therapeutic Phlebotomy Therapeutic phlebotomy is the controlled removal of blood from your body for the purpose of treating a medical condition. It is similar to donating blood. Usually, about a pint (470 mL) of blood is removed. The average adult has 9 to 12 pints (4.3 to 5.7 L) of blood. Therapeutic phlebotomy may be used to treat the following medical conditions:  Hemochromatosis. This is a condition in which there is too much iron in the blood.  Polycythemia vera. This is a condition in which there are too many red cells in the blood.  Porphyria cutanea tarda. This is a disease usually passed from one generation to the next (inherited). It is a condition in which an important part of hemoglobin is not made properly. This results in the build up of abnormal amounts of porphyrins in the body.  Sickle cell disease. This is an inherited disease. It is a condition in which the red blood cells form an abnormal crescent shape rather than a round shape. LET YOUR CAREGIVER KNOW ABOUT:  Allergies.  Medicines taken including herbs, eyedrops, over-the-counter medicines, and creams.  Use of steroids (by mouth or creams).  Previous problems with anesthetics or numbing medicine.  History of blood clots.  History of bleeding or blood problems.  Previous surgery.  Possibility of pregnancy, if this applies. RISKS AND COMPLICATIONS This is a simple and safe procedure. Problems are unlikely. However, problems can occur and may include:  Nausea or lightheadedness.  Low blood pressure.  Soreness, bleeding, swelling, or bruising at  the needle insertion site.  Infection. BEFORE THE PROCEDURE  This is a procedure that can be done as an outpatient. Confirm the time that you need to arrive for your procedure. Confirm whether there is a need to fast or withhold any medications. It is helpful to wear clothing with sleeves that can be raised above the elbow. A blood sample may be done to determine the amount of red blood cells or iron in your blood. Plan ahead of time to have someone drive you home after the procedure. PROCEDURE The entire procedure from preparation through recovery takes about 1 hour. The actual collection takes about 10 to 15 minutes.  A needle will be inserted into your vein.  Tubing and a collection bag will be attached to that needle.  Blood will flow through the needle and tubing into the collection bag.  You may be asked to open and close your hand slowly and continuously during the entire collection.  Once the specified amount of blood has been removed from your body, the collection bag and tubing will be clamped.  The needle will be removed.  Pressure will be held on the site of the needle insertion to stop the bleeding. Then a bandage will be placed over the needle insertion site. AFTER THE PROCEDURE    Your recovery will be assessed and monitored. If there are no problems, as an outpatient, you should be able to go home shortly after the procedure.  Document Released: 11/30/2010 Document Revised: 09/20/2011 Document Reviewed: 11/30/2010 ExitCare Patient Information 2015 [employer]. This information is not intended to replace advice given to you by your health care provider. Make sure you discuss any questions you have with your health care provider.  

## 2014-10-16 ENCOUNTER — Ambulatory Visit (HOSPITAL_BASED_OUTPATIENT_CLINIC_OR_DEPARTMENT_OTHER): Payer: BC Managed Care – PPO | Admitting: Hematology

## 2014-10-16 ENCOUNTER — Encounter: Payer: Self-pay | Admitting: Hematology

## 2014-10-16 ENCOUNTER — Other Ambulatory Visit (HOSPITAL_BASED_OUTPATIENT_CLINIC_OR_DEPARTMENT_OTHER): Payer: BC Managed Care – PPO

## 2014-10-16 ENCOUNTER — Telehealth: Payer: Self-pay | Admitting: Hematology

## 2014-10-16 ENCOUNTER — Ambulatory Visit (HOSPITAL_BASED_OUTPATIENT_CLINIC_OR_DEPARTMENT_OTHER): Payer: BC Managed Care – PPO

## 2014-10-16 LAB — CBC WITH DIFFERENTIAL/PLATELET
BASO%: 0.5 % (ref 0.0–2.0)
Basophils Absolute: 0 10*3/uL (ref 0.0–0.1)
EOS ABS: 0.2 10*3/uL (ref 0.0–0.5)
EOS%: 3.7 % (ref 0.0–7.0)
HCT: 42.2 % (ref 38.4–49.9)
HEMOGLOBIN: 14.5 g/dL (ref 13.0–17.1)
LYMPH%: 33 % (ref 14.0–49.0)
MCH: 29.7 pg (ref 27.2–33.4)
MCHC: 34.3 g/dL (ref 32.0–36.0)
MCV: 86.4 fL (ref 79.3–98.0)
MONO#: 0.6 10*3/uL (ref 0.1–0.9)
MONO%: 9.6 % (ref 0.0–14.0)
NEUT%: 53.2 % (ref 39.0–75.0)
NEUTROS ABS: 3.1 10*3/uL (ref 1.5–6.5)
Platelets: 195 10*3/uL (ref 140–400)
RBC: 4.89 10*6/uL (ref 4.20–5.82)
RDW: 12.7 % (ref 11.0–14.6)
WBC: 5.8 10*3/uL (ref 4.0–10.3)
lymph#: 1.9 10*3/uL (ref 0.9–3.3)

## 2014-10-16 LAB — FERRITIN CHCC: Ferritin: 84 ng/ml (ref 22–316)

## 2014-10-16 NOTE — Progress Notes (Signed)
0907-Phlebotomy to right ac, drained 250 grams.  Needle slipped out and phlebotomy done again to right ac and 300 grams removed without difficulty.  Pressure dressing applied.  Pt refused snack after phlebotomy, but did drink water.  Pt states [facility] and will eat again as soon as [facility].  VS stable at time of discharge.

## 2014-10-16 NOTE — Progress Notes (Signed)
[facility]  Telephone:(336) [phone] Fax:(336) [phone]  Clinic New Consult Note   Patient Care Team: Jac Canavanavid S Tysinger, PA-C as PCP - General (Family Medicine) Jeani HawkingPatrick Hung, MD as Consulting Physician (Gastroenterology) 10/16/2014  CHIEF COMPLAINTS/PURPOSE OF CONSULTATION:  Hereditary Hemachromatosis  Referring physician: Dr. Jeani HawkingPatrick Hung  HISTORY OF PRESENTING ILLNESS:  Michael DryKevin Coor 29 y.o. male is here because of newly diagnosed hereditary hemochromatosis.  He has strong family history of hemochromatosis, with his maternal grandmother father, maternal uncle and his mother who was recently diagnosed with hemochromatosis. His father died of neuroendocrine tumor of stomach, but he was also diagnosed of his coronary artery disease, type 2 diabetes and a thyroidectomy. He was recently tested for HFE gene and was found to have C282Y/C282Y mutation. His lab showed fasting blood glucose 103, ferritin 774, iron saturation 69%, hemoglobin 16, AST 34, ALT 59.    He feels well overall, and has no complaints. 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 He exercise 4 times a week and has a very physical active lifestyle. He denies any chest pain, shortness breath on exertion, abdominal discomfort, or any recent weight change.  INTERIM HISTORY: Caryn BeeKevin returns for follow-up. He has been on phlebotomy every 2 weeks in the past 2 months, and if tolerated very well. He feels well overall and denies any complaints.  MEDICAL HISTORY:  Past Medical History  Diagnosis Date  . Allergy   . Childhood asthma   . Vision problem     wears contacts  . Hyperlipidemia     SURGICAL HISTORY: Past Surgical History  Procedure Laterality Date  . Shoulder surgery      x 3 surgeries, 2 for labral tear; right shoulder  . Wisdom tooth extraction  01/2013    SOCIAL HISTORY: History   Social History  . Marital Status: Single    Spouse Name: N/A  .  Number of Children: N/A  . Years of Education: N/A   Occupational History  . football coach [employer]   Social History Main Topics  . Smoking status: Never Smoker   . Smokeless tobacco: Not on file  . Alcohol Use: 6.0 oz/week    5 Glasses of wine, 5 Cans of beer per week  . Drug Use: No  . Sexual Activity: Not on file   Other Topics Concern  . Not on file   Social History Narrative   Girlfriend x 4 years, no children, exercise 4 times per week with running and weight lifting, Heritage managerootball Coach, Publishing rights managerteaching biology and anatomy, [employer].  Was full time coach at [employer] prior.  Girlfriend and he just bought house 05/2014.    FAMILY HISTORY: Family History  Problem Relation Age of Onset  . Diabetes Father   . Heart disease Father     CABG age 30yo  . Cancer Father      neuroendocrine tumor of stomach  . Other Father     died post surgery with organ failure after tumor removal  . Stroke Neg Hx   . Hypertension Neg Hx   . Hyperlipidemia Neg Hx   . Hemochromatosis Mother     CYS282Y gene  . Hemochromatosis Maternal Uncle   . Hemochromatosis Maternal Grandmother     ALLERGIES:  has No Known Allergies.  MEDICATIONS:  Current Outpatient Prescriptions  Medication Sig Dispense Refill  . atorvastatin (LIPITOR) 20 MG tablet TAKE 1 TABLET BY MOUTH DAILY 90 tablet 0  .  ibuprofen (ADVIL,MOTRIN) 400 MG tablet Take 400 mg by mouth every 6 (six) hours as needed for pain.     No current facility-administered medications for this visit.    REVIEW OF SYSTEMS:   Constitutional: Denies fevers, chills or abnormal night sweats Eyes: Denies blurriness of vision, double vision or watery eyes Ears, nose, mouth, throat, and face: Denies mucositis or sore throat Respiratory: Denies cough, dyspnea or wheezes Cardiovascular: Denies palpitation, chest discomfort or lower extremity swelling Gastrointestinal:  Denies nausea, heartburn or change in bowel habits Skin: Denies  abnormal skin rashes Lymphatics: Denies new lymphadenopathy or easy bruising Neurological:Denies numbness, tingling or new weaknesses Behavioral/Psych: Mood is stable, no new changes  All other systems were reviewed with the patient and are negative.  PHYSICAL EXAMINATION: ECOG PERFORMANCE STATUS: 0 - Asymptomatic  Filed Vitals:   10/16/14 0828  BP: 115/56  Pulse: 49  Temp: 97.8 F (36.6 C)  Resp: 18   Filed Weights   10/16/14 0828  Weight: 244 lb 3.2 oz (110.768 kg)    GENERAL:alert, no distress and comfortable SKIN: skin color, texture, turgor are normal, no rashes or significant lesions EYES: normal, conjunctiva are pink and non-injected, sclera clear OROPHARYNX:no exudate, no erythema and lips, buccal mucosa, and tongue normal  NECK: supple, thyroid normal size, non-tender, without nodularity LYMPH:  no palpable lymphadenopathy in the cervical, axillary or inguinal LUNGS: clear to auscultation and percussion with normal breathing effort HEART: regular rate & rhythm and no murmurs and no lower extremity edema ABDOMEN:abdomen soft, non-tender and normal bowel sounds Musculoskeletal:no cyanosis of digits and no clubbing  PSYCH: alert & oriented x 3 with fluent speech NEURO: no focal motor/sensory deficits  LABORATORY DATA:  I have reviewed the data as listed Lab Results  Component Value Date   WBC 5.8 10/16/2014   HGB 14.5 10/16/2014   HCT 42.2 10/16/2014   MCV 86.4 10/16/2014   PLT 195 10/16/2014    Recent Labs  06/12/14 0853 08/21/14 1432  NA 139 142  K 4.5 3.9  CL 103  --   CO2 27 25  GLUCOSE 103* 91  BUN 13 14.2  CREATININE 1.07 1.3  CALCIUM 9.9 9.7  PROT 7.1 7.3  ALBUMIN 4.6 4.6  AST 34 20  ALT 59* 22  ALKPHOS 73 72  BILITOT 1.0 0.69    RADIOGRAPHIC STUDIES: I have personally reviewed the radiological images as listed and agreed with the findings in the report.  US ABDOMEN 09/02/2014 IMPRESSION: Multiple small echogenic foci throughout the  liver. While these could reflect multiple small hemangiomas, these are nonspecific. Further evaluation with MRI with and without contrast is Recommended.  ECHO Study Conclusions 09/04/2014 - Left ventricle: The cavity size was normal. Systolic function was normal. Wall motion was normal; there were no regional wall motion abnormalities. Left ventricular diastolic function parameters were normal. - Left atrium: The atrium was mildly dilated.  ASSESSMENT & PLAN:  29 year old Caucasian male  1. 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 He was tested positive for HFE mutation. His ferritin level was 774 at presentation. -We discussed the natural history of hemochromatosis, complications without treatments, including but not limited to, liver failure and cirrhosis, diabetes, congestive heart failure, hypothyroidism, hypopituitarism, etc. He has slightly elevated liver enzyme and fasting glucose. -I reviewed his imaging study results. His echo was normal. Ultrasound showed multiple small echogenic foci through the liver, possible hemangioma. I'll obtain a abdominal MRI with  and without contrast to further investigate.  -His TSH and hemoglobin A1c were normal. AFP was normal. -We discussed the treatment options for hemochromatosis, mainly therapeutic phlebotomy, with a goal of keeping ferritin below 50. He agrees with the plan -He has had phlebotomy 4 times, ferritin has come down to 100 two weeks ago. He will have phlebotomy today. -We also discussed other strategies to decrease iron absorption, such as avoiding alcohol drinking, avoid vitamin C, no supplements containing iron, healthy diet and a regular exercise. -We'll also discussed screening hemochromatosis in his children in the future -I offered written material about hemochromatosis  Plan: -Phlebotomy today -CBC and  ferritin level monthly, and phlebotomy if ferritin level above 50 -Abdominal MRI with and without contrast -Return to clinic in 4 months.  All questions were answered. The patient knows to call the clinic with any problems, questions or concerns. I spent 20 minutes counseling the patient face to face. The total time spent in the appointment was 25 minutes and more than 50% was on counseling.     Malachy Mood, MD 10/16/2014 8:33 AM

## 2014-10-16 NOTE — Patient Instructions (Signed)

## 2014-10-16 NOTE — Telephone Encounter (Signed)
gave and printed appt sched and avs fo rpt for April thru Aug °

## 2014-11-06 ENCOUNTER — Ambulatory Visit (HOSPITAL_COMMUNITY)
Admission: RE | Admit: 2014-11-06 | Discharge: 2014-11-06 | Disposition: A | Discharge status: Home / Self Care | Payer: BC Managed Care – PPO | Source: Ambulatory Visit | Attending: Hematology | Admitting: Hematology

## 2014-11-06 LAB — POCT I-STAT CREATININE: Creatinine, Ser: 1.2 mg/dL (ref 0.50–1.35)

## 2014-11-06 MED ORDER — GADOBENATE DIMEGLUMINE 529 MG/ML IV SOLN
20.0000 mL | Freq: Once | INTRAVENOUS | Status: AC | PRN
  Administered 2014-11-06: 20 mL via INTRAVENOUS

## 2014-11-13 ENCOUNTER — Other Ambulatory Visit (HOSPITAL_BASED_OUTPATIENT_CLINIC_OR_DEPARTMENT_OTHER): Payer: BC Managed Care – PPO

## 2014-11-13 LAB — CBC WITH DIFFERENTIAL/PLATELET
BASO%: 0.6 % (ref 0.0–2.0)
Basophils Absolute: 0 10*3/uL (ref 0.0–0.1)
EOS%: 3.3 % (ref 0.0–7.0)
Eosinophils Absolute: 0.2 10*3/uL (ref 0.0–0.5)
HCT: 43.8 % (ref 38.4–49.9)
HEMOGLOBIN: 15.4 g/dL (ref 13.0–17.1)
LYMPH%: 33.5 % (ref 14.0–49.0)
MCH: 30 pg (ref 27.2–33.4)
MCHC: 35.2 g/dL (ref 32.0–36.0)
MCV: 85.2 fL (ref 79.3–98.0)
MONO#: 0.3 10*3/uL (ref 0.1–0.9)
MONO%: 6.1 % (ref 0.0–14.0)
NEUT%: 56.5 % (ref 39.0–75.0)
NEUTROS ABS: 2.9 10*3/uL (ref 1.5–6.5)
Platelets: 208 10*3/uL (ref 140–400)
RBC: 5.14 10*6/uL (ref 4.20–5.82)
RDW: 12.5 % (ref 11.0–14.6)
WBC: 5.1 10*3/uL (ref 4.0–10.3)
lymph#: 1.7 10*3/uL (ref 0.9–3.3)
nRBC: 0 % (ref 0–0)

## 2014-11-13 LAB — FERRITIN CHCC: Ferritin: 58 ng/ml (ref 22–316)

## 2014-11-15 ENCOUNTER — Ambulatory Visit (HOSPITAL_BASED_OUTPATIENT_CLINIC_OR_DEPARTMENT_OTHER): Payer: BC Managed Care – PPO

## 2014-11-15 NOTE — Progress Notes (Signed)
0810- Pt ferritin 58; reports eating breakfast prior to phlebotomy.  Phlebotomy performed [id number] using 16G phlebotomy needle set without difficulty to right AC. Pt tolerated well. Snack/drink provided.  0840- Pt discharged at 0840 in no acute distress, ambulatory, VSS.

## 2014-11-15 NOTE — Patient Instructions (Signed)

## 2014-11-25 ENCOUNTER — Telehealth: Payer: Self-pay | Admitting: *Deleted

## 2014-11-25 NOTE — Telephone Encounter (Addendum)
Left message on pt's cell # to call back for MRI result which is normal per Dr Mosetta PuttFeng. (Previous liver lesion on US not seen on MRI.)  Pt called back & this nurse returned call & received vm.  Message left that MRI was normal & asked for return call to make sure he received message.

## 2014-11-29 ENCOUNTER — Telehealth: Payer: Self-pay | Admitting: *Deleted

## 2014-11-29 NOTE — Telephone Encounter (Signed)
Pt returned call & MRI report given.

## 2014-12-11 ENCOUNTER — Other Ambulatory Visit (HOSPITAL_BASED_OUTPATIENT_CLINIC_OR_DEPARTMENT_OTHER): Payer: BC Managed Care – PPO

## 2014-12-11 LAB — CBC WITH DIFFERENTIAL/PLATELET
BASO%: 0.6 % (ref 0.0–2.0)
BASOS ABS: 0 10*3/uL (ref 0.0–0.1)
EOS%: 6.2 % (ref 0.0–7.0)
Eosinophils Absolute: 0.3 10*3/uL (ref 0.0–0.5)
HCT: 43.5 % (ref 38.4–49.9)
HGB: 15.7 g/dL (ref 13.0–17.1)
LYMPH%: 35.5 % (ref 14.0–49.0)
MCH: 30.4 pg (ref 27.2–33.4)
MCHC: 36.1 g/dL — AB (ref 32.0–36.0)
MCV: 84.3 fL (ref 79.3–98.0)
MONO#: 0.4 10*3/uL (ref 0.1–0.9)
MONO%: 8.2 % (ref 0.0–14.0)
NEUT%: 49.5 % (ref 39.0–75.0)
NEUTROS ABS: 2.4 10*3/uL (ref 1.5–6.5)
Platelets: 201 10*3/uL (ref 140–400)
RBC: 5.16 10*6/uL (ref 4.20–5.82)
RDW: 12.2 % (ref 11.0–14.6)
WBC: 4.9 10*3/uL (ref 4.0–10.3)
lymph#: 1.7 10*3/uL (ref 0.9–3.3)

## 2014-12-11 LAB — FERRITIN CHCC: Ferritin: 41 ng/ml (ref 22–316)

## 2015-01-15 ENCOUNTER — Other Ambulatory Visit: Payer: BC Managed Care – PPO

## 2015-01-17 ENCOUNTER — Other Ambulatory Visit (HOSPITAL_BASED_OUTPATIENT_CLINIC_OR_DEPARTMENT_OTHER): Payer: BC Managed Care – PPO

## 2015-01-17 LAB — CBC WITH DIFFERENTIAL/PLATELET
BASO%: 0.3 % (ref 0.0–2.0)
Basophils Absolute: 0 10*3/uL (ref 0.0–0.1)
EOS%: 3.2 % (ref 0.0–7.0)
Eosinophils Absolute: 0.2 10*3/uL (ref 0.0–0.5)
HEMATOCRIT: 44.6 % (ref 38.4–49.9)
HEMOGLOBIN: 15.8 g/dL (ref 13.0–17.1)
LYMPH#: 1.8 10*3/uL (ref 0.9–3.3)
LYMPH%: 30.7 % (ref 14.0–49.0)
MCH: 29.6 pg (ref 27.2–33.4)
MCHC: 35.3 g/dL (ref 32.0–36.0)
MCV: 83.9 fL (ref 79.3–98.0)
MONO#: 0.5 10*3/uL (ref 0.1–0.9)
MONO%: 8.7 % (ref 0.0–14.0)
NEUT%: 57.1 % (ref 39.0–75.0)
NEUTROS ABS: 3.4 10*3/uL (ref 1.5–6.5)
PLATELETS: 224 10*3/uL (ref 140–400)
RBC: 5.32 10*6/uL (ref 4.20–5.82)
RDW: 12.7 % (ref 11.0–14.6)
WBC: 6 10*3/uL (ref 4.0–10.3)

## 2015-01-17 LAB — FERRITIN CHCC: FERRITIN: 93 ng/mL (ref 22–316)

## 2015-02-12 ENCOUNTER — Ambulatory Visit: Payer: BC Managed Care – PPO | Admitting: Hematology

## 2015-02-12 ENCOUNTER — Other Ambulatory Visit: Payer: BC Managed Care – PPO

## 2015-02-13 ENCOUNTER — Other Ambulatory Visit (HOSPITAL_BASED_OUTPATIENT_CLINIC_OR_DEPARTMENT_OTHER): Payer: BC Managed Care – PPO

## 2015-02-13 ENCOUNTER — Ambulatory Visit (HOSPITAL_BASED_OUTPATIENT_CLINIC_OR_DEPARTMENT_OTHER): Payer: BC Managed Care – PPO

## 2015-02-13 ENCOUNTER — Encounter: Payer: Self-pay | Admitting: Hematology

## 2015-02-13 ENCOUNTER — Telehealth: Payer: Self-pay | Admitting: Hematology

## 2015-02-13 ENCOUNTER — Telehealth: Payer: Self-pay | Admitting: *Deleted

## 2015-02-13 ENCOUNTER — Ambulatory Visit (HOSPITAL_BASED_OUTPATIENT_CLINIC_OR_DEPARTMENT_OTHER): Payer: BC Managed Care – PPO | Admitting: Hematology

## 2015-02-13 LAB — CBC WITH DIFFERENTIAL/PLATELET
BASO%: 0.2 % (ref 0.0–2.0)
Basophils Absolute: 0 10*3/uL (ref 0.0–0.1)
EOS%: 2.1 % (ref 0.0–7.0)
Eosinophils Absolute: 0.1 10*3/uL (ref 0.0–0.5)
HCT: 41.4 % (ref 38.4–49.9)
HGB: 15.1 g/dL (ref 13.0–17.1)
LYMPH#: 1.8 10*3/uL (ref 0.9–3.3)
LYMPH%: 35.5 % (ref 14.0–49.0)
MCH: 30.4 pg (ref 27.2–33.4)
MCHC: 36.5 g/dL — AB (ref 32.0–36.0)
MCV: 83.5 fL (ref 79.3–98.0)
MONO#: 0.3 10*3/uL (ref 0.1–0.9)
MONO%: 6.4 % (ref 0.0–14.0)
NEUT%: 55.8 % (ref 39.0–75.0)
NEUTROS ABS: 2.9 10*3/uL (ref 1.5–6.5)
Platelets: 197 10*3/uL (ref 140–400)
RBC: 4.96 10*6/uL (ref 4.20–5.82)
RDW: 13.2 % (ref 11.0–14.6)
WBC: 5.2 10*3/uL (ref 4.0–10.3)

## 2015-02-13 LAB — FERRITIN CHCC: FERRITIN: 39 ng/mL (ref 22–316)

## 2015-02-13 NOTE — Patient Instructions (Signed)

## 2015-02-13 NOTE — Telephone Encounter (Signed)
Per staff message and POF I have scheduled appts. Advised scheduler of appts. JMW  

## 2015-02-13 NOTE — Telephone Encounter (Signed)
per pof to sch pt appt-gave pt copy of avs-sent MW email to sch phlebotomy-pt going t phlebotomy in 1 hr per myrtle

## 2015-02-13 NOTE — Progress Notes (Signed)
[facility]  [phone] Fax:([phone]) [phone]  Clinic New Consult Note   Patient Care Team: Jac Canavan, PA-C as PCP - General (Family Medicine) Jeani Hawking, MD as Consulting Physician (Gastroenterology) 02/13/2015  CHIEF COMPLAINTS/PURPOSE OF CONSULTATION:  Hereditary Hemachromatosis  Referring physician: Dr. Jeani Hawking  HISTORY OF PRESENTING ILLNESS:  Michael Lucero 29 y.o. male is here because of newly diagnosed hereditary hemochromatosis.  He has strong family history of hemochromatosis, with his maternal grandmother father, maternal uncle and his mother who was recently diagnosed with hemochromatosis. His father died of neuroendocrine tumor of stomach, but he was also diagnosed of his coronary artery disease, type 2 diabetes and a thyroidectomy. He was recently tested for HFE gene and was found to have C282Y/C282Y mutation. His lab showed fasting blood glucose 103, ferritin 774, iron saturation 69%, hemoglobin 16, AST 34, ALT 59.    He feels well overall, and has no complaints. 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 He exercise 4 times a week and has a very physical active lifestyle. He denies any chest pain, shortness breath on exertion, abdominal discomfort, or any recent weight change.  INTERIM HISTORY: Michael Lucero returns for follow-up. He has been on phlebotomy every month for the past 3-4 month, did not need it in June, and donated one unit of blood to very across in July. He feels very well, remains very physically active. He noticed his vision sometimes gets blurry after football practice outside, but resolves after a while. No other complaints.   MEDICAL HISTORY:  Past Medical History  Diagnosis Date  . Allergy   . Childhood asthma   . Vision problem     wears contacts  . Hyperlipidemia     SURGICAL HISTORY: Past Surgical History  Procedure Laterality Date  . Shoulder surgery      x 3  surgeries, 2 for labral tear; right shoulder  . Wisdom tooth extraction  01/2013    SOCIAL HISTORY: History   Social History  . Marital Status: Single    Spouse Name: N/A  . Number of Children: N/A  . Years of Education: N/A   Occupational History  . football coach [employer]   Social History Main Topics  . Smoking status: Never Smoker   . Smokeless tobacco: Not on file  . Alcohol Use: 6.0 oz/week    5 Glasses of wine, 5 Cans of beer per week  . Drug Use: No  . Sexual Activity: Not on file   Other Topics Concern  . Not on file   Social History Narrative   Girlfriend x 4 years, no children, exercise 4 times per week with running and weight lifting, Heritage manager, Publishing rights manager and anatomy, [employer].  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  Girlfriend and he just bought house 05/2014.    FAMILY HISTORY: Family History  Problem Relation Age of Onset  . Diabetes Father   . Heart disease Father     CABG age 50yo  . Cancer Father      neuroendocrine tumor of stomach  . Other Father     died post surgery with organ failure after tumor removal  . Stroke Neg Hx   . Hypertension Neg Hx   . Hyperlipidemia Neg Hx   . Hemochromatosis Mother     CYS282Y gene  . Hemochromatosis Maternal Uncle   . Hemochromatosis Maternal Grandmother     ALLERGIES:  has No  Known Allergies.  MEDICATIONS:  Current Outpatient Prescriptions  Medication Sig Dispense Refill  . atorvastatin (LIPITOR) 20 MG tablet TAKE 1 TABLET BY MOUTH DAILY 90 tablet 0  . ibuprofen (ADVIL,MOTRIN) 400 MG tablet Take 400 mg by mouth every 6 (six) hours as needed for pain.     No current facility-administered medications for this visit.    REVIEW OF SYSTEMS:   Constitutional: Denies fevers, chills or abnormal night sweats Eyes: Denies blurriness of vision, double vision or watery eyes Ears, nose, mouth, throat, and face: Denies mucositis or sore throat Respiratory: Denies cough,  dyspnea or wheezes Cardiovascular: Denies palpitation, chest discomfort or lower extremity swelling Gastrointestinal:  Denies nausea, heartburn or change in bowel habits Skin: Denies abnormal skin rashes Lymphatics: Denies new lymphadenopathy or easy bruising Neurological:Denies numbness, tingling or new weaknesses Behavioral/Psych: Mood is stable, no new changes  All other systems were reviewed with the patient and are negative.  PHYSICAL EXAMINATION: ECOG PERFORMANCE STATUS: 0 - Asymptomatic  Filed Vitals:   02/13/15 1110  BP: 132/72  Pulse: 65  Temp: 98.6 F (37 C)  Resp: 18   Filed Weights   02/13/15 1110  Weight: 254 lb 4.8 oz (115.35 kg)    GENERAL:alert, no distress and comfortable SKIN: skin color, texture, turgor are normal, no rashes or significant lesions EYES: normal, conjunctiva are pink and non-injected, sclera clear OROPHARYNX:no exudate, no erythema and lips, buccal mucosa, and tongue normal  NECK: supple, thyroid normal size, non-tender, without nodularity LYMPH:  no palpable lymphadenopathy in the cervical, axillary or inguinal LUNGS: clear to auscultation and percussion with normal breathing effort HEART: regular rate & rhythm and no murmurs and no lower extremity edema ABDOMEN:abdomen soft, non-tender and normal bowel sounds Musculoskeletal:no cyanosis of digits and no clubbing  PSYCH: alert & oriented x 3 with fluent speech NEURO: no focal motor/sensory deficits  LABORATORY DATA:  I have reviewed the data as listed Lab Results  Component Value Date   WBC 5.2 02/13/2015   HGB 15.1 02/13/2015   HCT 41.4 02/13/2015   MCV 83.5 02/13/2015   PLT 197 02/13/2015    Recent Labs  06/12/14 0853 08/21/14 1432 11/06/14 1712  NA 139 142  --   K 4.5 3.9  --   CL 103  --   --   CO2 27 25  --   GLUCOSE 103* 91  --   BUN 13 14.2  --   CREATININE 1.07 1.3 1.20  CALCIUM 9.9 9.7  --   PROT 7.1 7.3  --   ALBUMIN 4.6 4.6  --   AST 34 20  --   ALT 59* 22   --   ALKPHOS 73 72  --   BILITOT 1.0 0.69  --     RADIOGRAPHIC STUDIES: I have personally reviewed the radiological images as listed and agreed with the findings in the report.  US ABDOMEN 09/02/2014 IMPRESSION: Multiple small echogenic foci throughout the liver. While these could reflect multiple small hemangiomas, these are nonspecific. Further evaluation with MRI with and without contrast is Recommended.  ECHO Study Conclusions 09/04/2014 - Left ventricle: The cavity size was normal. Systolic function was normal. Wall motion was normal; there were no regional wall motion abnormalities. Left ventricular diastolic function parameters were normal. - Left atrium: The atrium was mildly dilated.  Liver MRI 11/06/2014  IMPRESSION: Negative MRI abdomen.  Specifically, the liver is within normal limits.  ASSESSMENT & PLAN:  29 year old Caucasian male  1. Hereditary hemochromatosis, HFE homozygous  C282Y/C282Y mutation -He has strong family history of hemochromatosis, in autosomal dominant inheritance pattern. He was tested positive for HFE mutation. His ferritin level was 774 at presentation. -We discussed the natural history of hemochromatosis, complications without treatments, including but not limited to, liver failure and cirrhosis, diabetes, congestive heart failure, hypothyroidism, hypopituitarism, etc. He has slightly elevated liver enzyme and fasting glucose. -I reviewed his imaging study results. His echo was normal. Ultrasound showed multiple small echogenic foci through the liver, possible hemangioma. I'll obtain a abdominal MRI with and without contrast to further investigate.  -His TSH and hemoglobin A1c were normal. AFP was normal. -We discussed the treatment options for hemochromatosis, mainly therapeutic phlebotomy, with a goal of keeping ferritin below 50. He agrees with the plan -We'll also discussed screening hemochromatosis in his children in the  future -Continue monthly lab and phlebotomy if ferritin more than 50 or iron saturation >50%  -We'll proceed phlebotomy today, his ferritin level is still pending   2. Liver lesion on [place] -We obtain a liver MRI, which was normal  Plan: -Phlebotomy today -CBC and ferritin level monthly, and phlebotomy if ferritin level above 50 or iron saturation >50% -will schedule his phlebotomy one week after his lab -Return to clinic in 6 months.  All questions were answered. The patient knows to call the clinic with any problems, questions or concerns. I spent 20 minutes counseling the patient face to face. The total time spent in the appointment was 25 minutes and more than 50% was on counseling.     Malachy Mood, MD 02/13/2015 11:53 AM

## 2015-02-13 NOTE — Progress Notes (Signed)
Pt presented to infusion room for therapeutic phlebotomy.  Pt reports eating lunch before coming and drinking plenty of fluids.  Phlebotomy performed via R AC starting at 13:45 and ending at 13:58 with no complications.  Pt intially stated he felt a little light headed at end of phlebotomy.  Laid pt back, elevated feet, and provided drink.  After about 3 minutes pt stated the light headedness had gone away and he was feeling fine.  Pt remained for 30 minute observation period and vitals remained stable.  Pt discharged ambulatory with no questions or concerns.

## 2015-03-20 ENCOUNTER — Other Ambulatory Visit (HOSPITAL_BASED_OUTPATIENT_CLINIC_OR_DEPARTMENT_OTHER): Payer: BC Managed Care – PPO

## 2015-03-20 LAB — CBC WITH DIFFERENTIAL/PLATELET
BASO%: 0.6 % (ref 0.0–2.0)
Basophils Absolute: 0 10*3/uL (ref 0.0–0.1)
EOS ABS: 0.2 10*3/uL (ref 0.0–0.5)
EOS%: 3 % (ref 0.0–7.0)
HCT: 42.4 % (ref 38.4–49.9)
HGB: 15.2 g/dL (ref 13.0–17.1)
LYMPH%: 31.8 % (ref 14.0–49.0)
MCH: 30.3 pg (ref 27.2–33.4)
MCHC: 35.8 g/dL (ref 32.0–36.0)
MCV: 84.5 fL (ref 79.3–98.0)
MONO#: 0.4 10*3/uL (ref 0.1–0.9)
MONO%: 7.5 % (ref 0.0–14.0)
NEUT#: 3.4 10*3/uL (ref 1.5–6.5)
NEUT%: 57.1 % (ref 39.0–75.0)
PLATELETS: 209 10*3/uL (ref 140–400)
RBC: 5.02 10*6/uL (ref 4.20–5.82)
RDW: 13.1 % (ref 11.0–14.6)
WBC: 5.9 10*3/uL (ref 4.0–10.3)
lymph#: 1.9 10*3/uL (ref 0.9–3.3)

## 2015-04-17 ENCOUNTER — Other Ambulatory Visit (HOSPITAL_BASED_OUTPATIENT_CLINIC_OR_DEPARTMENT_OTHER): Payer: BC Managed Care – PPO

## 2015-04-17 LAB — CBC WITH DIFFERENTIAL/PLATELET
BASO%: 0.6 % (ref 0.0–2.0)
BASOS ABS: 0 10*3/uL (ref 0.0–0.1)
EOS ABS: 0.1 10*3/uL (ref 0.0–0.5)
EOS%: 2.2 % (ref 0.0–7.0)
HCT: 41.4 % (ref 38.4–49.9)
HGB: 14.5 g/dL (ref 13.0–17.1)
LYMPH%: 28.1 % (ref 14.0–49.0)
MCH: 29.6 pg (ref 27.2–33.4)
MCHC: 34.9 g/dL (ref 32.0–36.0)
MCV: 84.8 fL (ref 79.3–98.0)
MONO#: 0.4 10*3/uL (ref 0.1–0.9)
MONO%: 5.6 % (ref 0.0–14.0)
NEUT%: 63.5 % (ref 39.0–75.0)
NEUTROS ABS: 4.1 10*3/uL (ref 1.5–6.5)
Platelets: 213 10*3/uL (ref 140–400)
RBC: 4.88 10*6/uL (ref 4.20–5.82)
RDW: 12.4 % (ref 11.0–14.6)
WBC: 6.4 10*3/uL (ref 4.0–10.3)
lymph#: 1.8 10*3/uL (ref 0.9–3.3)

## 2015-04-17 LAB — FERRITIN CHCC: Ferritin: 42 ng/ml (ref 22–316)

## 2015-04-23 ENCOUNTER — Other Ambulatory Visit: Payer: Self-pay | Admitting: *Deleted

## 2015-04-23 ENCOUNTER — Telehealth: Payer: Self-pay | Admitting: *Deleted

## 2015-04-23 NOTE — Telephone Encounter (Signed)
Message left on pt's identified vm that he does not need a phlebotomy tomorrow due to ferritin = 42.  Requested call back to confirm message received.

## 2015-05-16 ENCOUNTER — Other Ambulatory Visit (HOSPITAL_BASED_OUTPATIENT_CLINIC_OR_DEPARTMENT_OTHER): Payer: BC Managed Care – PPO

## 2015-05-16 LAB — CBC WITH DIFFERENTIAL/PLATELET
BASO%: 0.7 % (ref 0.0–2.0)
Basophils Absolute: 0 10*3/uL (ref 0.0–0.1)
EOS%: 3.1 % (ref 0.0–7.0)
Eosinophils Absolute: 0.2 10*3/uL (ref 0.0–0.5)
HEMATOCRIT: 43.8 % (ref 38.4–49.9)
HEMOGLOBIN: 15.2 g/dL (ref 13.0–17.1)
LYMPH#: 1.8 10*3/uL (ref 0.9–3.3)
LYMPH%: 32.7 % (ref 14.0–49.0)
MCH: 29.5 pg (ref 27.2–33.4)
MCHC: 34.8 g/dL (ref 32.0–36.0)
MCV: 84.6 fL (ref 79.3–98.0)
MONO#: 0.5 10*3/uL (ref 0.1–0.9)
MONO%: 8.7 % (ref 0.0–14.0)
NEUT#: 3 10*3/uL (ref 1.5–6.5)
NEUT%: 54.8 % (ref 39.0–75.0)
PLATELETS: 227 10*3/uL (ref 140–400)
RBC: 5.17 10*6/uL (ref 4.20–5.82)
RDW: 12.6 % (ref 11.0–14.6)
WBC: 5.4 10*3/uL (ref 4.0–10.3)

## 2015-05-16 LAB — FERRITIN CHCC: Ferritin: 29 ng/ml (ref 22–316)

## 2015-05-21 ENCOUNTER — Telehealth: Payer: Self-pay | Admitting: *Deleted

## 2015-05-21 NOTE — Telephone Encounter (Signed)
-----   Message from Malachy MoodYan Feng, MD sent at 05/21/2015  2:23 PM EST ----- Regarding: RE: phlebotomy 11/10 Yes, please cancel. Could you let pt know? Thanks much,  Terrace ArabiaYan  ----- Message -----    From: Hinton LovelyAlexis S Ezio Wieck, RN    Sent: 05/21/2015   9:42 AM      To: Michiel Siteshu Le Bray, RN, Malachy MoodYan Feng, MD Subject: phlebotomy 11/10                               Pt ferritin 29 on 11/4; phlebotomy only if ferritin >50.  Are we canceling tomorrow's phlebotomy? Thanks!

## 2015-05-21 NOTE — Telephone Encounter (Signed)
Pt made aware of ferritin level and phlebotomy is not needed; instructed to keep f/u appts in Dec. Pt voiced understanding and appreciated call.

## 2015-06-12 ENCOUNTER — Other Ambulatory Visit (HOSPITAL_BASED_OUTPATIENT_CLINIC_OR_DEPARTMENT_OTHER): Payer: BC Managed Care – PPO

## 2015-06-12 LAB — CBC WITH DIFFERENTIAL/PLATELET
BASO%: 0.6 % (ref 0.0–2.0)
Basophils Absolute: 0 10*3/uL (ref 0.0–0.1)
EOS%: 2.5 % (ref 0.0–7.0)
Eosinophils Absolute: 0.2 10*3/uL (ref 0.0–0.5)
HCT: 42.6 % (ref 38.4–49.9)
HGB: 14.8 g/dL (ref 13.0–17.1)
LYMPH%: 34.3 % (ref 14.0–49.0)
MCH: 29.1 pg (ref 27.2–33.4)
MCHC: 34.8 g/dL (ref 32.0–36.0)
MCV: 83.6 fL (ref 79.3–98.0)
MONO#: 0.4 10*3/uL (ref 0.1–0.9)
MONO%: 7.2 % (ref 0.0–14.0)
NEUT%: 55.4 % (ref 39.0–75.0)
NEUTROS ABS: 3.4 10*3/uL (ref 1.5–6.5)
Platelets: 202 10*3/uL (ref 140–400)
RBC: 5.1 10*6/uL (ref 4.20–5.82)
RDW: 12.9 % (ref 11.0–14.6)
WBC: 6.1 10*3/uL (ref 4.0–10.3)
lymph#: 2.1 10*3/uL (ref 0.9–3.3)

## 2015-06-12 LAB — FERRITIN CHCC: FERRITIN: 30 ng/mL (ref 22–316)

## 2015-06-18 ENCOUNTER — Telehealth: Payer: Self-pay | Admitting: *Deleted

## 2015-06-18 NOTE — Telephone Encounter (Signed)
Called pt & discussed labs & no need for phlebotomy tomorrow.  He already knew his lab results & had not planned to come tomorrow. Phlebotomy cancelled.

## 2015-07-10 ENCOUNTER — Other Ambulatory Visit (HOSPITAL_BASED_OUTPATIENT_CLINIC_OR_DEPARTMENT_OTHER): Payer: BC Managed Care – PPO

## 2015-07-10 LAB — CBC WITH DIFFERENTIAL/PLATELET
BASO%: 0.4 % (ref 0.0–2.0)
BASOS ABS: 0 10*3/uL (ref 0.0–0.1)
EOS ABS: 0.2 10*3/uL (ref 0.0–0.5)
EOS%: 3.7 % (ref 0.0–7.0)
HEMATOCRIT: 42.5 % (ref 38.4–49.9)
HGB: 15.5 g/dL (ref 13.0–17.1)
LYMPH#: 1.9 10*3/uL (ref 0.9–3.3)
LYMPH%: 33.7 % (ref 14.0–49.0)
MCH: 29.9 pg (ref 27.2–33.4)
MCHC: 36.5 g/dL — AB (ref 32.0–36.0)
MCV: 82 fL (ref 79.3–98.0)
MONO#: 0.4 10*3/uL (ref 0.1–0.9)
MONO%: 7.1 % (ref 0.0–14.0)
NEUT#: 3.1 10*3/uL (ref 1.5–6.5)
NEUT%: 55.1 % (ref 39.0–75.0)
PLATELETS: 214 10*3/uL (ref 140–400)
RBC: 5.18 10*6/uL (ref 4.20–5.82)
RDW: 12.5 % (ref 11.0–14.6)
WBC: 5.7 10*3/uL (ref 4.0–10.3)

## 2015-07-10 LAB — FERRITIN: Ferritin: 36 ng/ml (ref 22–316)

## 2015-07-11 ENCOUNTER — Telehealth: Payer: Self-pay | Admitting: *Deleted

## 2015-07-11 NOTE — Telephone Encounter (Signed)
Called pt & informed via vm regarding lab results & will cancel phlebotomy for next week.  Req. Call back to know he received message.

## 2015-07-11 NOTE — Telephone Encounter (Signed)
Pt returned call & knows not to come for phlebotomy.

## 2015-07-11 NOTE — Telephone Encounter (Signed)
-----   Message from Malachy MoodYan Feng, MD sent at 07/11/2015  8:51 AM EST ----- Please let pt know that his lab result and cancel his phlebotomy next week.   Malachy MoodFeng, Yan 07/11/2015

## 2015-08-07 ENCOUNTER — Other Ambulatory Visit: Payer: BC Managed Care – PPO

## 2015-08-18 ENCOUNTER — Encounter: Payer: Self-pay | Admitting: Hematology

## 2015-08-18 ENCOUNTER — Telehealth: Payer: Self-pay | Admitting: Hematology

## 2015-08-18 ENCOUNTER — Other Ambulatory Visit (HOSPITAL_BASED_OUTPATIENT_CLINIC_OR_DEPARTMENT_OTHER): Payer: BC Managed Care – PPO

## 2015-08-18 ENCOUNTER — Ambulatory Visit (HOSPITAL_BASED_OUTPATIENT_CLINIC_OR_DEPARTMENT_OTHER): Payer: BC Managed Care – PPO | Admitting: Hematology

## 2015-08-18 LAB — CBC WITH DIFFERENTIAL/PLATELET
BASO%: 0.7 % (ref 0.0–2.0)
Basophils Absolute: 0 10*3/uL (ref 0.0–0.1)
EOS%: 3.3 % (ref 0.0–7.0)
Eosinophils Absolute: 0.2 10*3/uL (ref 0.0–0.5)
HCT: 41.5 % (ref 38.4–49.9)
HGB: 14.6 g/dL (ref 13.0–17.1)
LYMPH%: 39.4 % (ref 14.0–49.0)
MCH: 29.2 pg (ref 27.2–33.4)
MCHC: 35.2 g/dL (ref 32.0–36.0)
MCV: 82.9 fL (ref 79.3–98.0)
MONO#: 0.4 10*3/uL (ref 0.1–0.9)
MONO%: 7.7 % (ref 0.0–14.0)
NEUT%: 48.9 % (ref 39.0–75.0)
NEUTROS ABS: 2.9 10*3/uL (ref 1.5–6.5)
PLATELETS: 197 10*3/uL (ref 140–400)
RBC: 5.01 10*6/uL (ref 4.20–5.82)
RDW: 13.4 % (ref 11.0–14.6)
WBC: 5.8 10*3/uL (ref 4.0–10.3)
lymph#: 2.3 10*3/uL (ref 0.9–3.3)

## 2015-08-18 LAB — IRON AND TIBC
%SAT: 37 % (ref 20–55)
IRON: 98 ug/dL (ref 42–163)
TIBC: 265 ug/dL (ref 202–409)
UIBC: 167 ug/dL (ref 117–376)

## 2015-08-18 LAB — FERRITIN: Ferritin: 37 ng/ml (ref 22–316)

## 2015-08-18 NOTE — Progress Notes (Signed)
[facility]  [phone] [phone]  Clinic Follow Up Note   Patient Care Team: Jac Canavan, PA-C as PCP - General (Family Medicine) Jeani Hawking, MD as Consulting Physician (Gastroenterology) 08/18/2015  CHIEF COMPLAINTs:  Follow up hereditary Hemachromatosis   HISTORY OF PRESENTING ILLNESS:  Michael Lucero 30 y.o. male is here because of newly diagnosed hereditary hemochromatosis.  He has strong family history of hemochromatosis, with his maternal grandmother father, maternal uncle and his mother who was recently diagnosed with hemochromatosis. His father died of neuroendocrine tumor of stomach, but he was also diagnosed of his coronary artery disease, type 2 diabetes and a thyroidectomy. He was recently tested for HFE gene and was found to have C282Y/C282Y mutation. His lab showed fasting blood glucose 103, ferritin 774, iron saturation 69%, hemoglobin 16, AST 34, ALT 59.    He feels well overall, and has no complaints. 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 He exercise 4 times a week and has a very physical active lifestyle. He denies any chest pain, shortness breath on exertion, abdominal discomfort, or any recent weight change.  CURRENT THERAPY: Phlebotomy if ferritin > 50 oR transferrin saturation >50%  INTERIM HISTORY: Michael Lucero returns for follow-up. He is doing well overall. His last phlebotomy was in August 2016, and he has been very compliant with lab test every months in the past 6 months. He did donate blood 2 days ago. He denies any significant pain, discomfort, or other symptoms. He remains to be physically active, he is very cautious about iron level include, and avoid vitamin C with food.  MEDICAL HISTORY:  Past Medical History  Diagnosis Date  . Allergy   . Childhood asthma   . Vision problem     wears contacts  . Hyperlipidemia     SURGICAL HISTORY: Past Surgical History    Procedure Laterality Date  . Shoulder surgery      x 3 surgeries, 2 for labral tear; right shoulder  . Wisdom tooth extraction  01/2013    SOCIAL HISTORY: Social History   Social History  . Marital Status: Single    Spouse Name: N/A  . Number of Children: N/A  . Years of Education: N/A   Occupational History  . football coach [employer]   Social History Main Topics  . Smoking status: Never Smoker   . Smokeless tobacco: Not on file  . Alcohol Use: 6.0 oz/week    5 Glasses of wine, 5 Cans of beer per week  . Drug Use: No  . Sexual Activity: Not on file   Other Topics Concern  . Not on file   Social History Narrative   Girlfriend x 4 years, no children, exercise 4 times per week with running and weight lifting, Heritage manager, Publishing rights manager and anatomy, [employer].  Was full time coach at [employer] prior.  Girlfriend and he just bought house 05/2014.    FAMILY HISTORY: Family History  Problem Relation Age of Onset  . Diabetes Father   . Heart disease Father     CABG age 53yo  . Cancer Father      neuroendocrine tumor of stomach  . Other Father     died post surgery with organ failure after tumor removal  . Stroke Neg Hx   . Hypertension Neg Hx   . Hyperlipidemia Neg Hx   . Hemochromatosis Mother     CYS282Y gene  . Hemochromatosis  Maternal Uncle   . Hemochromatosis Maternal Grandmother     ALLERGIES:  has No Known Allergies.  MEDICATIONS:  Current Outpatient Prescriptions  Medication Sig Dispense Refill  . atorvastatin (LIPITOR) 20 MG tablet TAKE 1 TABLET BY MOUTH DAILY 90 tablet 0  . ibuprofen (ADVIL,MOTRIN) 400 MG tablet Take 400 mg by mouth every 6 (six) hours as needed for pain.     No current facility-administered medications for this visit.    REVIEW OF SYSTEMS:   Constitutional: Denies fevers, chills or abnormal night sweats Eyes: Denies blurriness of vision, double vision or watery eyes Ears, nose, mouth, throat, and  face: Denies mucositis or sore throat Respiratory: Denies cough, dyspnea or wheezes Cardiovascular: Denies palpitation, chest discomfort or lower extremity swelling Gastrointestinal:  Denies nausea, heartburn or change in bowel habits Skin: Denies abnormal skin rashes Lymphatics: Denies new lymphadenopathy or easy bruising Neurological:Denies numbness, tingling or new weaknesses Behavioral/Psych: Mood is stable, no new changes  All other systems were reviewed with the patient and are negative.  PHYSICAL EXAMINATION: ECOG PERFORMANCE STATUS: 0 - Asymptomatic  Filed Vitals:   08/18/15 0843  BP: 136/79  Pulse: 80  Temp: 97.4 F (36.3 C)  Resp: 20   Filed Weights   08/18/15 0843  Weight: 265 lb 14.4 oz (120.611 kg)    GENERAL:alert, no distress and comfortable SKIN: skin color, texture, turgor are normal, no rashes or significant lesions EYES: normal, conjunctiva are pink and non-injected, sclera clear OROPHARYNX:no exudate, no erythema and lips, buccal mucosa, and tongue normal  NECK: supple, thyroid normal size, non-tender, without nodularity LYMPH:  no palpable lymphadenopathy in the cervical, axillary or inguinal LUNGS: clear to auscultation and percussion with normal breathing effort HEART: regular rate & rhythm and no murmurs and no lower extremity edema ABDOMEN:abdomen soft, non-tender and normal bowel sounds Musculoskeletal:no cyanosis of digits and no clubbing  PSYCH: alert & oriented x 3 with fluent speech NEURO: no focal motor/sensory deficits  LABORATORY DATA:  I have reviewed the data as listed CBC Latest Ref Rng 08/18/2015 07/10/2015 06/12/2015  WBC 4.0 - 10.3 10e3/uL 5.8 5.7 6.1  Hemoglobin 13.0 - 17.1 g/dL 16.1 09.6 04.5  Hematocrit 38.4 - 49.9 % 41.5 42.5 42.6  Platelets 140 - 400 10e3/uL 197 214 202    CMP Latest Ref Rng 11/06/2014 08/21/2014 06/12/2014  Glucose 70 - 140 mg/dl - 91 409(W)  BUN 7.0 - 26.0 mg/dL - 11.9 13  Creatinine 1.47 - 1.35 mg/dL 8.29 1.3  5.62  Sodium 136 - 145 mEq/L - 142 139  Potassium 3.5 - 5.1 mEq/L - 3.9 4.5  Chloride 96 - 112 mEq/L - - 103  CO2 22 - 29 mEq/L - 25 27  Calcium 8.4 - 10.4 mg/dL - 9.7 9.9  Total Protein 6.4 - 8.3 g/dL - 7.3 7.1  Total Bilirubin 0.20 - 1.20 mg/dL - 1.30 1.0  Alkaline Phos 40 - 150 U/L - 72 73  AST 5 - 34 U/L - 20 34  ALT 0 - 55 U/L - 22 59(H)   Results for AKASHDEEP, CHUBA (MRN [id number]) as of 08/18/2015 06:15  Ref. Range 02/13/2015 10:53 04/17/2015 07:53 05/16/2015 08:04 06/12/2015 08:09 07/10/2015 08:07  Ferritin Latest Ref Range: 22-316 ng/ml 39 42 29 30 36    RADIOGRAPHIC STUDIES: I have personally reviewed the radiological images as listed and agreed with the findings in the report.  US ABDOMEN 09/02/2014 IMPRESSION: Multiple small echogenic foci throughout the liver. While these could reflect multiple small hemangiomas, these are  nonspecific. Further evaluation with MRI with and without contrast is Recommended.  ECHO Study Conclusions 09/04/2014 - Left ventricle: The cavity size was normal. Systolic function was normal. Wall motion was normal; there were no regional wall motion abnormalities. Left ventricular diastolic function parameters were normal. - Left atrium: The atrium was mildly dilated.  Liver MRI 11/06/2014  IMPRESSION: Negative MRI abdomen.  Specifically, the liver is within normal limits.  ASSESSMENT & PLAN:  30 year old Caucasian male  1. 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 He was tested positive for HFE mutation. His ferritin level was 774 at presentation. -We discussed the natural history of hemochromatosis, complications without treatments, including but not limited to, liver failure and cirrhosis, diabetes, congestive heart failure, hypothyroidism, hypopituitarism, etc. He has slightly elevated liver enzyme and fasting glucose. -I  reviewed his imaging study results. His echo was normal. Ultrasound showed multiple small echogenic foci through the liver, possible hemangioma. I'll obtain a abdominal MRI with and without contrast to further investigate.  -His TSH and hemoglobin A1c were normal. AFP was normal. -We discussed the treatment options for hemochromatosis, mainly therapeutic phlebotomy, with a goal of keeping ferritin below 50 and transferrin<50%. He agrees with the plan -We'll also discussed screening hemochromatosis in his children in the future -We'll monitor his labs every 3 months and phlebotomy if ferritin more than 50 or iron saturation >50%  -He just had a blood donation 2 days ago, today's CBC is normal, ferritin and iron level are still pending, no phlebotomy today -We'll monitor his liver function every 6 months, and liver ultrasound every 2-3 years   2. Liver lesion on [place] -We obtain a liver MRI, which was normal  Plan: -CBC and ferritin level every 3 month, CMP and iron study every 6 months, and phlebotomy if ferritin level above 50 or iron saturation >50% -Return to clinic in 6 months.  All questions were answered. The patient knows to call the clinic with any problems, questions or concerns. I spent 15 minutes counseling the patient face to face. The total time spent in the appointment was 20 minutes and more than 50% was on counseling.     Malachy Mood, MD 08/18/2015 9:13 AM

## 2015-08-18 NOTE — Telephone Encounter (Signed)
per pof to sch pt appt-gave pt copy of avs °

## 2015-11-17 ENCOUNTER — Other Ambulatory Visit (HOSPITAL_BASED_OUTPATIENT_CLINIC_OR_DEPARTMENT_OTHER): Payer: BC Managed Care – PPO

## 2015-11-17 LAB — CBC WITH DIFFERENTIAL/PLATELET
BASO%: 0.8 % (ref 0.0–2.0)
BASOS ABS: 0 10*3/uL (ref 0.0–0.1)
EOS ABS: 0.2 10*3/uL (ref 0.0–0.5)
EOS%: 4.2 % (ref 0.0–7.0)
HCT: 43.7 % (ref 38.4–49.9)
HGB: 15.1 g/dL (ref 13.0–17.1)
LYMPH%: 35.2 % (ref 14.0–49.0)
MCH: 29.2 pg (ref 27.2–33.4)
MCHC: 34.4 g/dL (ref 32.0–36.0)
MCV: 84.7 fL (ref 79.3–98.0)
MONO#: 0.6 10*3/uL (ref 0.1–0.9)
MONO%: 10.5 % (ref 0.0–14.0)
NEUT%: 49.3 % (ref 39.0–75.0)
NEUTROS ABS: 2.9 10*3/uL (ref 1.5–6.5)
Platelets: 187 10*3/uL (ref 140–400)
RBC: 5.16 10*6/uL (ref 4.20–5.82)
RDW: 12.5 % (ref 11.0–14.6)
WBC: 5.9 10*3/uL (ref 4.0–10.3)
lymph#: 2.1 10*3/uL (ref 0.9–3.3)

## 2015-11-17 LAB — COMPREHENSIVE METABOLIC PANEL
ALT: 34 U/L (ref 0–55)
AST: 24 U/L (ref 5–34)
Albumin: 4.2 g/dL (ref 3.5–5.0)
Alkaline Phosphatase: 62 U/L (ref 40–150)
Anion Gap: 8 mEq/L (ref 3–11)
BUN: 15.1 mg/dL (ref 7.0–26.0)
CO2: 24 meq/L (ref 22–29)
Calcium: 9.5 mg/dL (ref 8.4–10.4)
Chloride: 104 mEq/L (ref 98–109)
Creatinine: 1.1 mg/dL (ref 0.7–1.3)
EGFR: 87 mL/min/{1.73_m2} — AB (ref >90)
GLUCOSE: 97 mg/dL (ref 70–140)
POTASSIUM: 4.2 meq/L (ref 3.5–5.1)
SODIUM: 136 meq/L (ref 136–145)
Total Bilirubin: 0.88 mg/dL (ref 0.20–1.20)
Total Protein: 7 g/dL (ref 6.4–8.3)

## 2015-11-17 LAB — FERRITIN: Ferritin: 24 ng/ml (ref 22–316)

## 2016-01-24 IMAGING — MR MR ABDOMEN WO/W CM
9 of 18 series · 21 of 48 positions shown · IV contrast (multihance)
Comparison: Abdominal ultrasound dated 09/02/2014

CLINICAL DATA: Multiple liver lesions on ultrasound

EXAM:
MRI ABDOMEN WITHOUT AND WITH CONTRAST
TECHNIQUE: Multiplanar multisequence MR imaging of the abdomen was performed
both before and after the administration of intravenous contrast.
CONTRAST:  20mL MULTIHANCE GADOBENATE DIMEGLUMINE 529 MG/ML IV SOLN

[Series 3: T2 fat-sat · axial · 5.0mm · 0.78mm/px · z∈[-56,+204]mm · 3 of 53 slices shown]
[im 1/53]
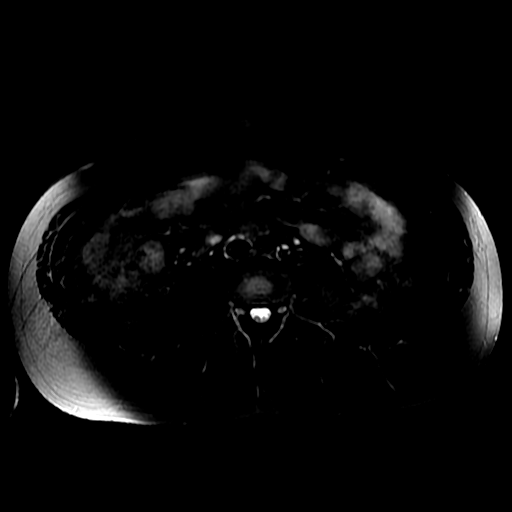
[im 27/53]
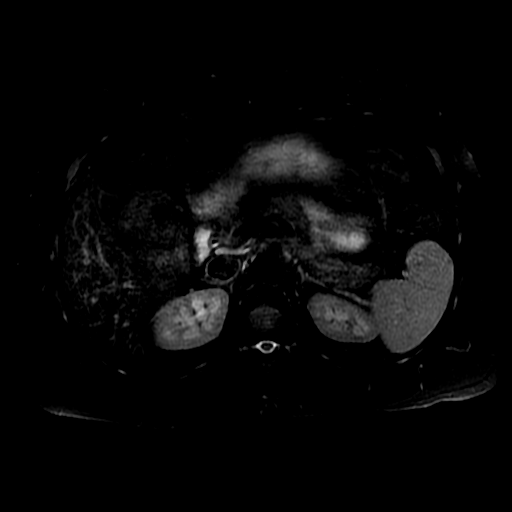
[im 53/53]
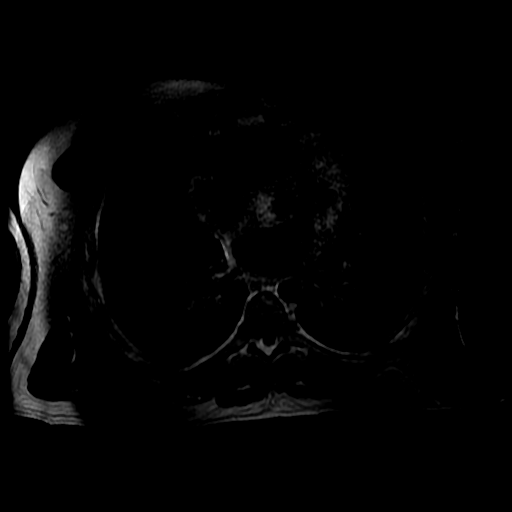

[Series 4: DWI b500 · axial · 6.0mm · 1.48mm/px · z∈[-45,+221]mm · 2 of 70 slices shown]
[im 1/70]
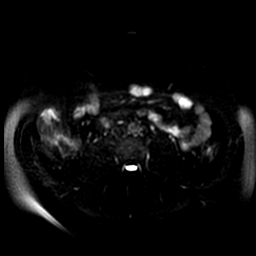
[im 70/70]
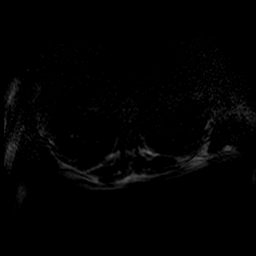

[Series 7: ax dualecho · axial · 5.0mm · 0.78mm/px · z∈[-63,+167]mm · 3 of 94 slices shown]
[im 1/94]
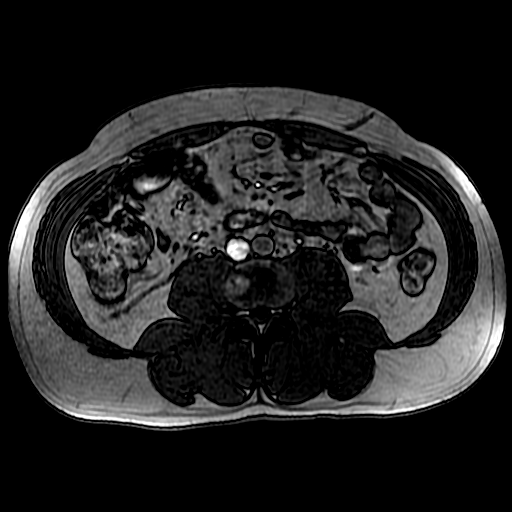
[im 47/94]
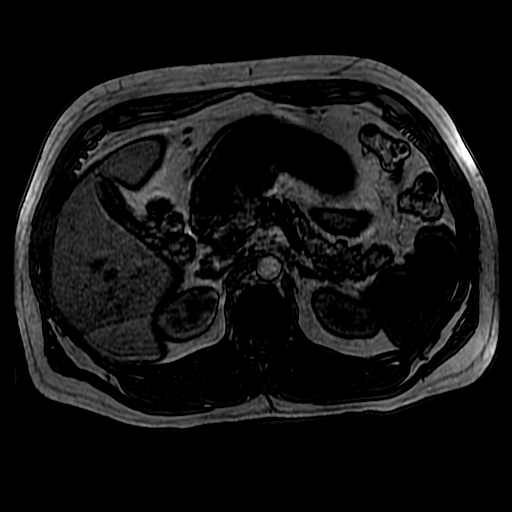
[im 94/94]
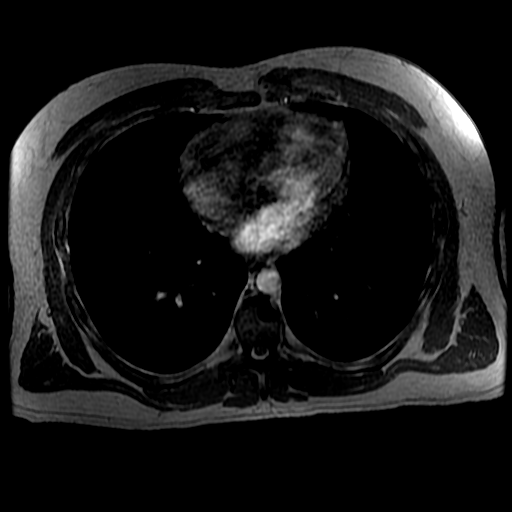

[Series 8: T2 · axial · 5.0mm · 0.78mm/px · z∈[-63,+167]mm · 2 of 47 slices shown (1 of 2)]
[im 1/47]
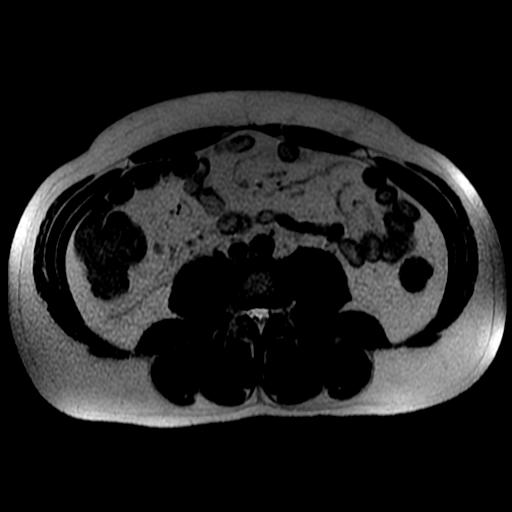
[im 47/47]
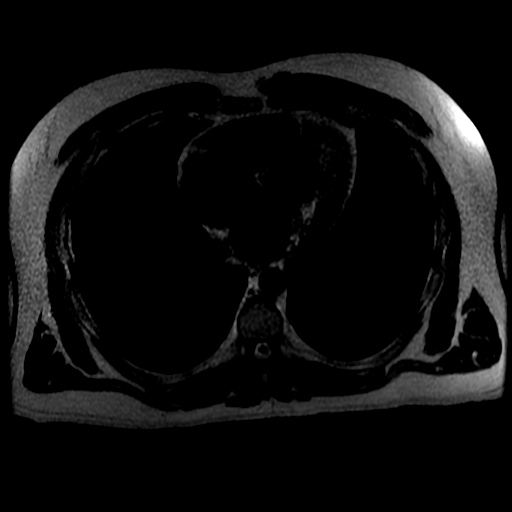

[Series 10: T2 · coronal · 5.0mm · 0.78mm/px · 2 of 45 slices shown (2 of 2)]
[im 1/45]
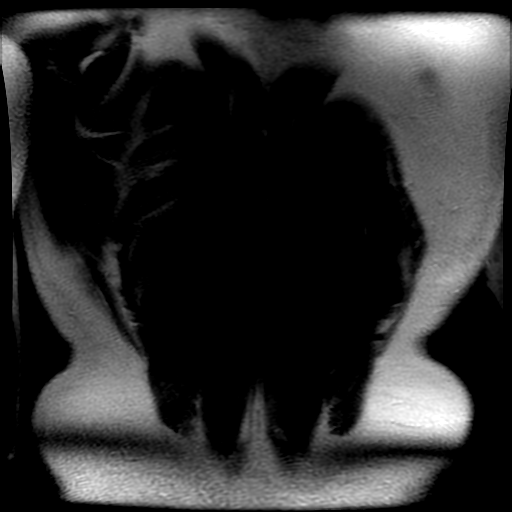
[im 45/45]
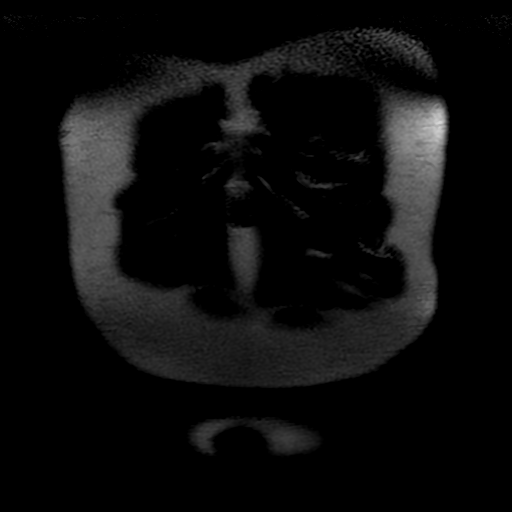

[Series 12: bSSFP · axial · 5.0mm · 0.78mm/px · z∈[-63,+192]mm · 2 of 52 slices shown]
[im 1/52]
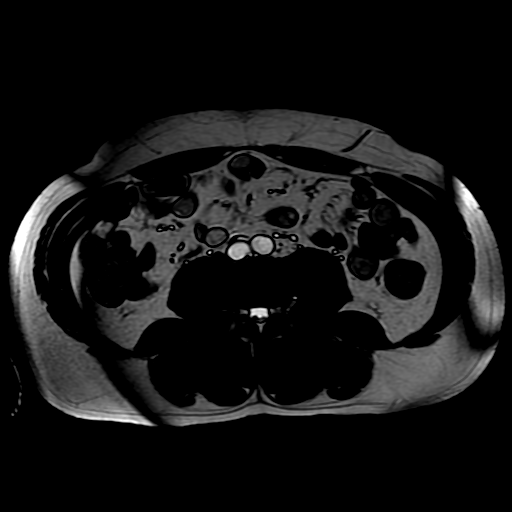
[im 52/52]
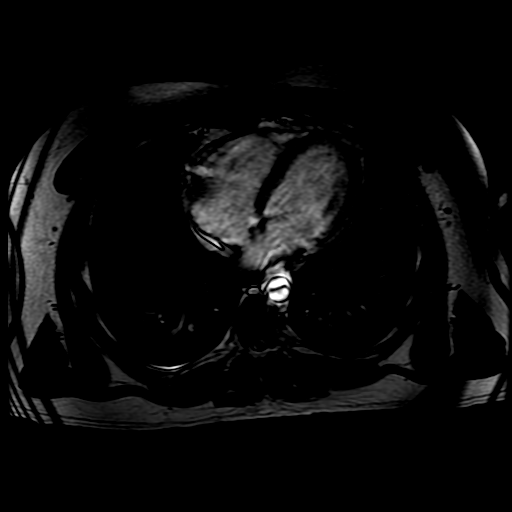

[Series 400: DWI · axial · 6.0mm · 1.48mm/px · 1 of 35 slices shown]
[im 1/35]
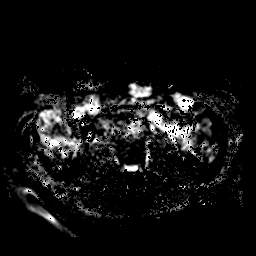

[Series 1300: T1 dynamic · axial · 5.0mm · 0.78mm/px · z∈[-60,+158]mm · 3 of 88 slices shown (1 of 2)]
[im 1/88]
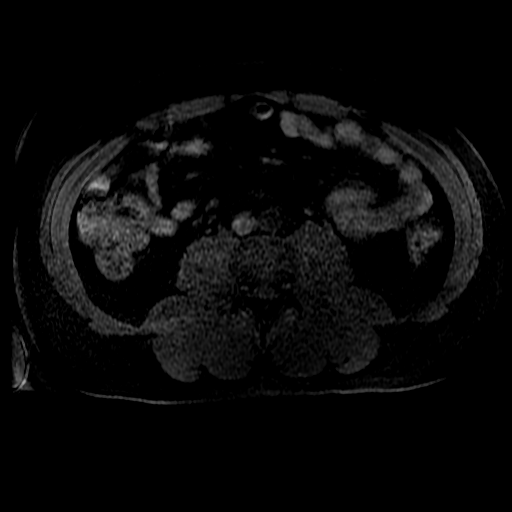
[im 44/88]
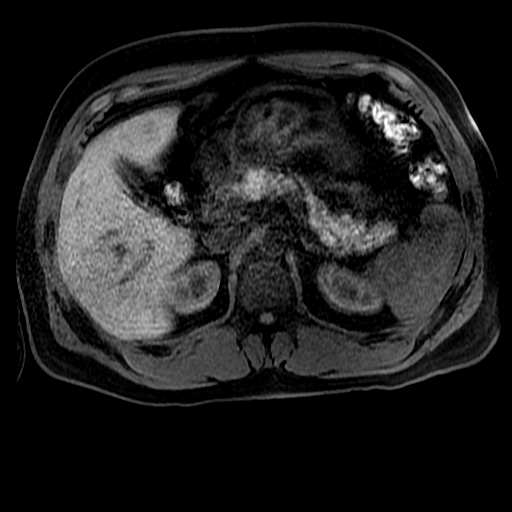
[im 88/88]
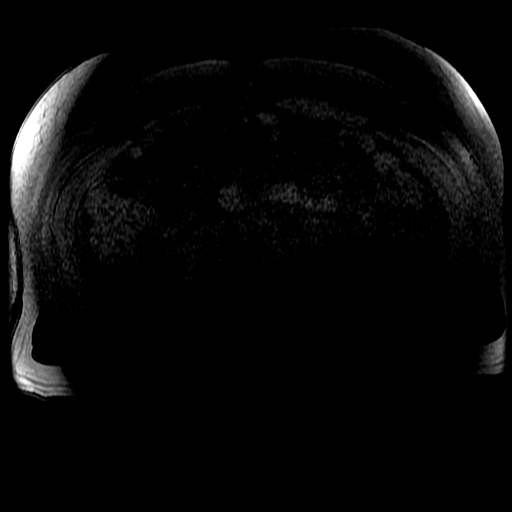

[Series 1301: T1 dynamic · axial · 5.0mm · 0.78mm/px · z∈[-60,+158]mm · 3 of 88 slices shown (2 of 2)]
[im 1/88]
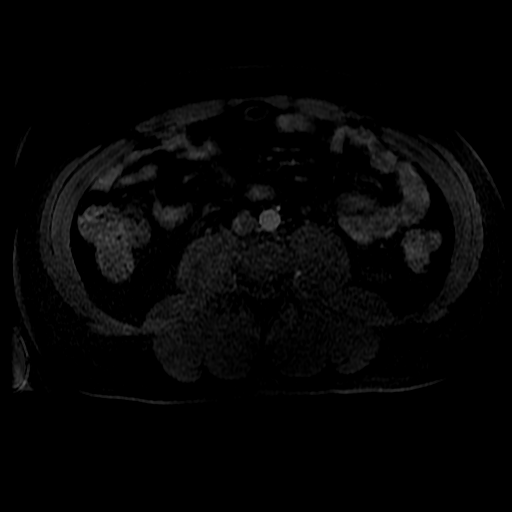
[im 44/88]
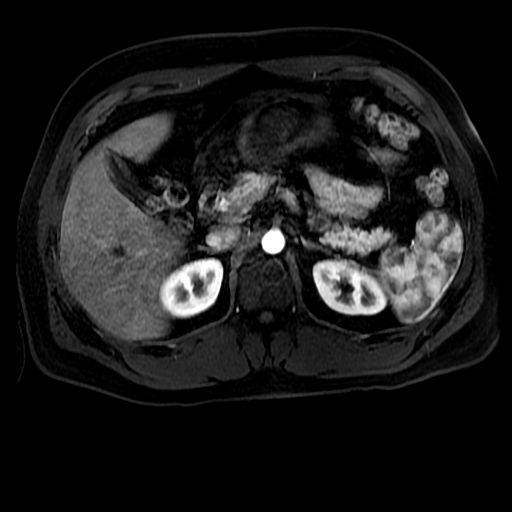
[im 88/88]
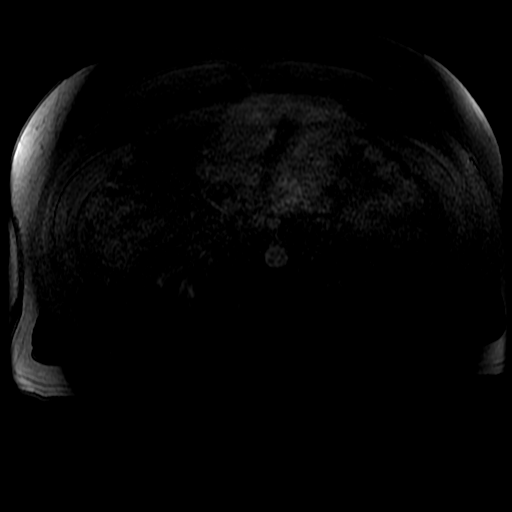

[21 of 48 positions shown; findings below may reference images not displayed]

FINDINGS: Lower chest:  Lung bases are clear.

Hepatobiliary: Liver is within normal limits on MRI. No focal
hepatic lesions or enhancement following contrast administration. No
hepatic steatosis. No susceptibility artifact to suggest iron
deposition. No morphologic findings of cirrhosis. No evidence of
restricted diffusion.

Gallbladder is underdistended. No intrahepatic or extrahepatic
ductal dilatation.

Pancreas: Within normal limits.

Spleen: Within normal limits.

Adrenals/Urinary Tract: Adrenal glands are within normal limits.

Kidneys within normal limits.  No hydronephrosis.

Stomach/Bowel: Stomach and visualized bowel are unremarkable.

Vascular/Lymphatic: No evidence of abdominal aortic aneurysm.

No suspicious abdominal lymphadenopathy.

Other: No abdominal ascites.

Musculoskeletal: No focal osseous lesions.
IMPRESSION: Negative MRI abdomen.

Specifically, the liver is within normal limits.

## 2016-02-04 ENCOUNTER — Encounter: Payer: Self-pay | Admitting: Medical

## 2016-02-04 ENCOUNTER — Ambulatory Visit (INDEPENDENT_AMBULATORY_CARE_PROVIDER_SITE_OTHER): Payer: BC Managed Care – PPO | Admitting: Medical

## 2016-02-04 VITALS — BP 110/78 | HR 57 | Ht 71.5 in | Wt 252.0 lb

## 2016-02-04 DIAGNOSIS — E785 Hyperlipidemia, unspecified: Secondary | ICD-10-CM

## 2016-02-04 DIAGNOSIS — Z111 Encounter for screening for respiratory tuberculosis: Secondary | ICD-10-CM | POA: Diagnosis not present

## 2016-02-04 DIAGNOSIS — T466X5A Adverse effect of antihyperlipidemic and antiarteriosclerotic drugs, initial encounter: Secondary | ICD-10-CM

## 2016-02-04 DIAGNOSIS — Z8349 Family history of other endocrine, nutritional and metabolic diseases: Secondary | ICD-10-CM

## 2016-02-04 DIAGNOSIS — Z0000 Encounter for general adult medical examination without abnormal findings: Secondary | ICD-10-CM

## 2016-02-04 NOTE — Progress Notes (Signed)
Subjective:   HPI  Michael Lucero is a 30 y.o. male who presents for a complete physical.   Medical team:  Dr. Malachy Mood, hematology  [facility], PA-C here for primary care  Sees eye doctor, dentist  Concerns: Complaint with cholesterol medication, but having consistent muscle aches, but he still takes it.  Started 2014.  Since last visit he did see hematology, got confirmation on hemochromatosis, been getting regular phlebotomy.   Doing well in general, no c/o.  Reviewed their medical, surgical, family, social, medication, and allergy history and updated chart as appropriate.  Past Medical History:  Diagnosis Date  . Allergy   . Childhood asthma   . Hemochromatosis 2016  . Hyperlipidemia   . Vision problem    wears contacts    Past Surgical History:  Procedure Laterality Date  . SHOULDER SURGERY     x 3 surgeries, 2 for labral tear; right shoulder  . WISDOM TOOTH EXTRACTION  01/2013    Social History   Social History  . Marital status: Single    Spouse name: N/A  . Number of children: N/A  . Years of education: N/A   Occupational History  . football coach [employer]   Social History Main Topics  . Smoking status: Never Smoker  . Smokeless tobacco: Not on file  . Alcohol use 3.0 oz/week    3 Glasses of wine, 2 Cans of beer per week  . Drug use: No  . Sexual activity: Not on file   Other Topics Concern  . Not on file   Social History Narrative   Married 12/20/15.  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  was teaching biology and anatomy, [employer].  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  Bought house 05/2014.    Family History  Problem Relation Age of Onset  . Diabetes Father   . Heart disease Father     CABG age 16yo  . Cancer Father      neuroendocrine tumor of stomach  . Other Father    died post surgery with organ failure after tumor removal  . Hemochromatosis Mother     CYS282Y gene  . Hemochromatosis Maternal Uncle   . Hemochromatosis Maternal Grandmother   . Stroke Neg Hx   . Hypertension Neg Hx   . Hyperlipidemia Neg Hx      Current Outpatient Prescriptions:  .  atorvastatin (LIPITOR) 20 MG tablet, TAKE 1 TABLET BY MOUTH DAILY, Disp: 90 tablet, Rfl: 0  No Known Allergies   Review of Systems Constitutional: -fever, -chills, -sweats, -unexpected weight change, -decreased appetite, -fatigue Allergy: -sneezing, -itching, -congestion Dermatology: -changing moles, --rash, -lumps ENT: -runny nose, -ear pain, -sore throat, -hoarseness, -sinus pain, -teeth pain, - ringing in ears, -hearing loss, -nosebleeds Cardiology: -chest pain, -palpitations, -swelling, -difficulty breathing when lying flat, -waking up short of breath Respiratory: -cough, -shortness of breath, -difficulty breathing with exercise or exertion, -wheezing, -coughing up blood Gastroenterology: -abdominal pain, -nausea, -vomiting, -diarrhea, -constipation, -blood in stool, -changes in bowel movement, -difficulty swallowing or eating Hematology: -bleeding, -bruising  Musculoskeletal: -joint aches, -muscle aches, -joint swelling, -back pain, -neck pain, -cramping, -changes in gait Ophthalmology: denies vision changes, eye redness, itching, discharge Urology: -burning with urination, -difficulty urinating, -blood in urine, -urinary frequency, -urgency, -incontinence Neurology: -headache, -weakness, -tingling, -numbness, -memory loss, -falls, -dizziness Psychology: -depressed  mood, -agitation, -sleep problems     Objective:   Physical Exam  BP 110/78   Pulse (!) 57   Ht 5' 11.5" (1.816 m)   Wt 252 lb (114.3 kg)   BMI 34.66 kg/m   General appearance: alert, no distress, WD/WN, stocky white male  Skin: right anterolateral shoulder with surgical scar, otherwise few scattered benign appearing macules,  right posterolateral upper chest just distal to axilla with triangular brown red macule, no worrisome lesions HEENT: normocephalic, conjunctiva/corneas normal, sclerae anicteric, PERRLA, EOMi, nares patent, no discharge or erythema, pharynx normal  Oral cavity: MMM, tongue normal, teeth in good repair  Neck: supple, no lymphadenopathy, no thyromegaly, no masses, normal ROM, no bruits Chest: non tender, normal shape and expansion  Heart: RRR, normal S1, S2, no murmurs  Lungs: CTA bilaterally, no wheezes, rhonchi, or rales  Abdomen: +bs, soft, non tender, non distended, no masses, no hepatomegaly, no splenomegaly, no bruits  Back: non tender, normal ROM, no scoliosis  Musculoskeletal: upper extremities non tender, no obvious deformity, normal ROM throughout, lower extremities non tender, no obvious deformity, normal ROM throughout  Extremities: no edema, no cyanosis, no clubbing  Pulses: 2+ symmetric, upper and lower extremities, normal cap refill  Neurological: alert, oriented x 3, CN2-12 intact, strength normal upper extremities and lower extremities, sensation normal throughout, DTRs 2+ throughout, no cerebellar signs, gait normal  Psychiatric: normal affect, behavior normal, pleasant  GU: normal male external genitalia, circumcised, nontender, no masses, no hernia, no lymphadenopathy  Rectal: deferred  Assessment and Plan :   Encounter Diagnoses  Name Primary?  . Encounter for health maintenance examination in adult Yes  . Dyslipidemia   . Hemochromatosis   . Family history of hemochromatosis   . Screening for tuberculosis   . Adverse reaction to statin medication, initial encounter     Physical exam - discussed healthy lifestyle, diet, exercise, preventative care, vaccinations, and addressed their concerns.   Given aches with statin, and no recent lab, will have him stop statin for a month, recheck for fasting lipid lab in 58mo to reassess need for statin. See your eye  doctor yearly for routine vision care. See your dentist yearly for routine dental care including hygiene visits twice yearly. C/t routine hematology f/u, phlebotomy, reviewed recent hematology notes PPD placed, f/u 48 hours for PPD reading C/t monthly testicular exams Follow-up pending labs

## 2016-02-09 ENCOUNTER — Other Ambulatory Visit (HOSPITAL_BASED_OUTPATIENT_CLINIC_OR_DEPARTMENT_OTHER): Payer: BC Managed Care – PPO

## 2016-02-09 LAB — CBC WITH DIFFERENTIAL/PLATELET
BASO%: 0.7 % (ref 0.0–2.0)
BASOS ABS: 0 10*3/uL (ref 0.0–0.1)
EOS%: 2.2 % (ref 0.0–7.0)
Eosinophils Absolute: 0.1 10*3/uL (ref 0.0–0.5)
HEMATOCRIT: 41.8 % (ref 38.4–49.9)
HEMOGLOBIN: 14.6 g/dL (ref 13.0–17.1)
LYMPH#: 1.8 10*3/uL (ref 0.9–3.3)
LYMPH%: 27.7 % (ref 14.0–49.0)
MCH: 29.3 pg (ref 27.2–33.4)
MCHC: 34.9 g/dL (ref 32.0–36.0)
MCV: 84.1 fL (ref 79.3–98.0)
MONO#: 0.5 10*3/uL (ref 0.1–0.9)
MONO%: 7 % (ref 0.0–14.0)
NEUT%: 62.4 % (ref 39.0–75.0)
NEUTROS ABS: 4.1 10*3/uL (ref 1.5–6.5)
Platelets: 233 10*3/uL (ref 140–400)
RBC: 4.97 10*6/uL (ref 4.20–5.82)
RDW: 14 % (ref 11.0–14.6)
WBC: 6.5 10*3/uL (ref 4.0–10.3)

## 2016-02-09 LAB — IRON AND TIBC
%SAT: 28 % (ref 20–55)
Iron: 90 ug/dL (ref 42–163)
TIBC: 315 ug/dL (ref 202–409)
UIBC: 225 ug/dL (ref 117–376)

## 2016-02-09 LAB — FERRITIN: FERRITIN: 22 ng/mL (ref 22–316)

## 2016-02-13 ENCOUNTER — Telehealth: Payer: Self-pay | Admitting: Hematology

## 2016-02-13 NOTE — Telephone Encounter (Signed)
pt cld to r/s appt-r/s and gave pt r/s time & date °

## 2016-02-16 ENCOUNTER — Ambulatory Visit: Payer: BC Managed Care – PPO | Admitting: Hematology

## 2016-02-20 ENCOUNTER — Ambulatory Visit (HOSPITAL_BASED_OUTPATIENT_CLINIC_OR_DEPARTMENT_OTHER): Payer: BC Managed Care – PPO | Admitting: Hematology

## 2016-02-20 ENCOUNTER — Encounter: Payer: Self-pay | Admitting: Hematology

## 2016-02-20 ENCOUNTER — Telehealth: Payer: Self-pay | Admitting: Hematology

## 2016-02-20 NOTE — Progress Notes (Signed)
[facility]  Telephone:([phone] Fax:([phone]) [phone]  Clinic Follow Up Note   Patient Care Team: Jac Canavan, PA-C as PCP - General (Family Medicine) Jeani Hawking, MD as Consulting Physician (Gastroenterology) 02/20/2016  CHIEF COMPLAINTs:  Follow up hereditary Hemachromatosis   HISTORY OF PRESENTING ILLNESS (08/21/2014):  Michael Lucero 30 y.o. male is here because of newly diagnosed hereditary hemochromatosis.  He has strong family history of hemochromatosis, with his maternal grandmother father, maternal uncle and his mother who was recently diagnosed with hemochromatosis. His father died of neuroendocrine tumor of stomach, but he was also diagnosed of his coronary artery disease, type 2 diabetes and a thyroidectomy. He was recently tested for HFE gene and was found to have C282Y/C282Y mutation. His lab showed fasting blood glucose 103, ferritin 774, iron saturation 69%, hemoglobin 16, AST 34, ALT 59.    He feels well overall, and has no complaints. 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 He exercise 4 times a week and has a very physical active lifestyle. He denies any chest pain, shortness breath on exertion, abdominal discomfort, or any recent weight change.  CURRENT THERAPY: Phlebotomy if ferritin > 50 or transferrin saturation >50%  INTERIM HISTORY: [name] returns for follow-up. He currently married last month, and I will be a PE Runner, broadcasting/film/video and football coach at [employer] high school. He is doing very well, he has donated blood at the [employer] 3 times this year, he has not required phlebotomy since August 2016. He denies any complaints, he is very physically active, exercise including lifting regularly.  MEDICAL HISTORY:  Past Medical History:  Diagnosis Date  . Allergy   . Childhood asthma   . Hemochromatosis 2016  . Hyperlipidemia   . Vision problem    wears contacts    SURGICAL HISTORY: Past Surgical  History:  Procedure Laterality Date  . SHOULDER SURGERY     x 3 surgeries, 2 for labral tear; right shoulder  . WISDOM TOOTH EXTRACTION  01/2013    SOCIAL HISTORY: Social History   Social History  . Marital status: Single    Spouse name: N/A  . Number of children: N/A  . Years of education: N/A   Occupational History  . football coach [employer]   Social History Main Topics  . Smoking status: Never Smoker  . Smokeless tobacco: Never Used  . Alcohol use 3.0 oz/week    3 Glasses of wine, 2 Cans of beer per week  . Drug use: No  . Sexual activity: Not on file   Other Topics Concern  . Not on file   Social History Narrative   Married 12/20/15.  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  was teaching biology and anatomy, [employer].  Was full time coach at [employer] prior.  Bought house 05/2014.    FAMILY HISTORY: Family History  Problem Relation Age of Onset  . Diabetes Father   . Heart disease Father     CABG age 56yo  . Cancer Father      neuroendocrine tumor of stomach  . Other Father     died post surgery with organ failure after tumor removal  . Hemochromatosis Mother     CYS282Y gene  . Hemochromatosis Maternal Uncle   . Hemochromatosis Maternal Grandmother   . Stroke  Neg Hx   . Hypertension Neg Hx   . Hyperlipidemia Neg Hx     ALLERGIES:  has No Known Allergies.  MEDICATIONS:  Current Outpatient Prescriptions  Medication Sig Dispense Refill  . atorvastatin (LIPITOR) 20 MG tablet TAKE 1 TABLET BY MOUTH DAILY (Patient not taking: Reported on 02/20/2016) 90 tablet 0   No current facility-administered medications for this visit.     REVIEW OF SYSTEMS:   Constitutional: Denies fevers, chills or abnormal night sweats Eyes: Denies blurriness of vision, double vision or watery eyes Ears, nose, mouth, throat, and face:  Denies mucositis or sore throat Respiratory: Denies cough, dyspnea or wheezes Cardiovascular: Denies palpitation, chest discomfort or lower extremity swelling Gastrointestinal:  Denies nausea, heartburn or change in bowel habits Skin: Denies abnormal skin rashes Lymphatics: Denies new lymphadenopathy or easy bruising Neurological:Denies numbness, tingling or new weaknesses Behavioral/Psych: Mood is stable, no new changes  All other systems were reviewed with the patient and are negative.  PHYSICAL EXAMINATION: ECOG PERFORMANCE STATUS: 0 - Asymptomatic  Vitals:   02/20/16 1302  BP: 120/66  Pulse: 63  Resp: 17  Temp: 98.9 F (37.2 C)   Filed Weights   02/20/16 1302  Weight: 248 lb 11.2 oz (112.8 kg)    GENERAL:alert, no distress and comfortable SKIN: skin color, texture, turgor are normal, no rashes or significant lesions EYES: normal, conjunctiva are pink and non-injected, sclera clear OROPHARYNX:no exudate, no erythema and lips, buccal mucosa, and tongue normal  NECK: supple, thyroid normal size, non-tender, without nodularity LYMPH:  no palpable lymphadenopathy in the cervical, axillary or inguinal LUNGS: clear to auscultation and percussion with normal breathing effort HEART: regular rate & rhythm and no murmurs and no lower extremity edema ABDOMEN:abdomen soft, non-tender and normal bowel sounds Musculoskeletal:no cyanosis of digits and no clubbing  PSYCH: alert & oriented x 3 with fluent speech NEURO: no focal motor/sensory deficits  LABORATORY DATA:  I have reviewed the data as listed CBC Latest Ref Rng & Units 02/09/2016 11/17/2015 08/18/2015  WBC 4.0 - 10.3 10e3/uL 6.5 5.9 5.8  Hemoglobin 13.0 - 17.1 g/dL 16.114.6 09.615.1 04.514.6  Hematocrit 38.4 - 49.9 % 41.8 43.7 41.5  Platelets 140 - 400 10e3/uL 233 187 197    CMP Latest Ref Rng & Units 11/17/2015 11/06/2014 08/21/2014  Glucose 70 - 140 mg/dl 97 - 91  BUN 7.0 - 40.926.0 mg/dL 81.115.1 - 91.414.2  Creatinine 0.7 - 1.3 mg/dL 1.1 7.821.20 1.3    Sodium 136 - 145 mEq/L 136 - 142  Potassium 3.5 - 5.1 mEq/L 4.2 - 3.9  Chloride 96 - 112 mEq/L - - -  CO2 22 - 29 mEq/L 24 - 25  Calcium 8.4 - 10.4 mg/dL 9.5 - 9.7  Total Protein 6.4 - 8.3 g/dL 7.0 - 7.3  Total Bilirubin 0.20 - 1.20 mg/dL 9.560.88 - 2.130.69  Alkaline Phos 40 - 150 U/L 62 - 72  AST 5 - 34 U/L 24 - 20  ALT 0 - 55 U/L 34 - 22   Results for Michael DryWALLACE, Kelden (MRN [id number]) as of 02/20/2016 08:05  Ref. Range 08/18/2015 08:21 11/17/2015 08:15 02/09/2016 13:24 02/09/2016 13:24  Iron Latest Ref Range: 42 - 163 ug/dL 98   90  UIBC Latest Ref Range: 117 - 376 ug/dL 629167   528225  TIBC Latest Ref Range: 202 - 409 ug/dL 413265   244315  %SAT Latest Ref Range: 20 - 55 % 37   28  Ferritin Latest Ref Range: 22 - 316 ng/ml 37  24 22    RADIOGRAPHIC STUDIES: I have personally reviewed the radiological images as listed and agreed with the findings in the report.  US ABDOMEN 09/02/2014 IMPRESSION: Multiple small echogenic foci throughout the liver. While these could reflect multiple small hemangiomas, these are nonspecific. Further evaluation with MRI with and without contrast is Recommended.  ECHO Study Conclusions 09/04/2014 - Left ventricle: The cavity size was normal. Systolic function was normal. Wall motion was normal; there were no regional wall motion abnormalities. Left ventricular diastolic function parameters were normal. - Left atrium: The atrium was mildly dilated.  Liver MRI 11/06/2014  IMPRESSION: Negative MRI abdomen.  Specifically, the liver is within normal limits.  ASSESSMENT & PLAN:  30 year old Caucasian male  1. 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 He was tested positive for HFE mutation. His ferritin level was 774 at presentation. -We discussed the natural history of hemochromatosis, complications without treatments, including but not limited to, liver  failure and cirrhosis, diabetes, congestive heart failure, hypothyroidism, hypopituitarism, etc. He has slightly elevated liver enzyme and fasting glucose. -I reviewed his imaging study results. His echo was normal. Ultrasound showed multiple small echogenic foci through the liver, possible hemangioma. I'll obtain a abdominal MRI with and without contrast to further investigate.  -His TSH and hemoglobin A1c were normal. AFP was normal. -We discussed the treatment options for hemochromatosis, mainly therapeutic phlebotomy, with a goal of keeping ferritin below 50 and transferrin<50%. He agrees with the plan -We'll also discussed screening hemochromatosis in his children in the future -He has been donating blood regularly, every 2-3 month, his ferritin has been in the 20-30, transferrin saturation <50%, no evidence of iron overload. -We'll monitor his labs every 4 months and phlebotomy if ferritin more than 50 or iron saturation >50%  -We'll monitor his liver function every 6 months, AFP once a year and liver ultrasound every 2-3 years   2. Liver lesion on [place] -We obtain a liver MRI, which was normal  Plan: -CBC and ferritin, iron study level every 4 month, CMP and AFP once a year, and phlebotomy if ferritin level above 50 or iron saturation >50% -Return to clinic in 12 months, lab and liver US one week before   All questions were answered. The patient knows to call the clinic with any problems, questions or concerns. I spent 15 minutes counseling the patient face to face. The total time spent in the appointment was 20 minutes and more than 50% was on counseling.     Malachy Mood, MD 02/20/2016 4:31 PM

## 2016-02-20 NOTE — Telephone Encounter (Signed)
per pof to sch pt appt-gave pt copy of avs °

## 2016-06-25 ENCOUNTER — Other Ambulatory Visit (HOSPITAL_BASED_OUTPATIENT_CLINIC_OR_DEPARTMENT_OTHER): Payer: BC Managed Care – PPO

## 2016-06-25 LAB — CBC WITH DIFFERENTIAL/PLATELET
BASO%: 0.7 % (ref 0.0–2.0)
BASOS ABS: 0 10*3/uL (ref 0.0–0.1)
EOS%: 3.3 % (ref 0.0–7.0)
Eosinophils Absolute: 0.2 10*3/uL (ref 0.0–0.5)
HEMATOCRIT: 44.7 % (ref 38.4–49.9)
HEMOGLOBIN: 15.5 g/dL (ref 13.0–17.1)
LYMPH#: 2.2 10*3/uL (ref 0.9–3.3)
LYMPH%: 39.6 % (ref 14.0–49.0)
MCH: 29 pg (ref 27.2–33.4)
MCHC: 34.7 g/dL (ref 32.0–36.0)
MCV: 83.5 fL (ref 79.3–98.0)
MONO#: 0.5 10*3/uL (ref 0.1–0.9)
MONO%: 8.5 % (ref 0.0–14.0)
NEUT#: 2.6 10*3/uL (ref 1.5–6.5)
NEUT%: 47.9 % (ref 39.0–75.0)
Platelets: 210 10*3/uL (ref 140–400)
RBC: 5.36 10*6/uL (ref 4.20–5.82)
RDW: 13.1 % (ref 11.0–14.6)
WBC: 5.5 10*3/uL (ref 4.0–10.3)

## 2016-06-25 LAB — IRON AND TIBC
%SAT: 28 % (ref 20–55)
IRON: 82 ug/dL (ref 42–163)
TIBC: 289 ug/dL (ref 202–409)
UIBC: 207 ug/dL (ref 117–376)

## 2016-06-25 LAB — COMPREHENSIVE METABOLIC PANEL
ALBUMIN: 3.8 g/dL (ref 3.5–5.0)
ALK PHOS: 72 U/L (ref 40–150)
ALT: 38 U/L (ref 0–55)
AST: 23 U/L (ref 5–34)
Anion Gap: 8 mEq/L (ref 3–11)
BUN: 14 mg/dL (ref 7.0–26.0)
CALCIUM: 9.2 mg/dL (ref 8.4–10.4)
CO2: 24 mEq/L (ref 22–29)
CREATININE: 1.3 mg/dL (ref 0.7–1.3)
Chloride: 105 mEq/L (ref 98–109)
EGFR: 74 mL/min/{1.73_m2} — ABNORMAL LOW (ref >90)
Glucose: 112 mg/dl (ref 70–140)
Potassium: 4.5 mEq/L (ref 3.5–5.1)
Sodium: 137 mEq/L (ref 136–145)
TOTAL PROTEIN: 7.2 g/dL (ref 6.4–8.3)
Total Bilirubin: 0.57 mg/dL (ref 0.20–1.20)

## 2016-06-25 LAB — FERRITIN: Ferritin: 33 ng/ml (ref 22–316)

## 2016-08-13 ENCOUNTER — Ambulatory Visit (INDEPENDENT_AMBULATORY_CARE_PROVIDER_SITE_OTHER): Payer: BC Managed Care – PPO | Admitting: Family Medicine

## 2016-08-13 ENCOUNTER — Encounter: Payer: Self-pay | Admitting: Family Medicine

## 2016-08-13 VITALS — BP 120/80 | HR 80 | Temp 98.4°F | Resp 16 | Wt 258.4 lb

## 2016-08-13 DIAGNOSIS — J069 Acute upper respiratory infection, unspecified: Secondary | ICD-10-CM | POA: Diagnosis not present

## 2016-08-13 DIAGNOSIS — B9789 Other viral agents as the cause of diseases classified elsewhere: Secondary | ICD-10-CM

## 2016-08-13 DIAGNOSIS — K529 Noninfective gastroenteritis and colitis, unspecified: Secondary | ICD-10-CM | POA: Diagnosis not present

## 2016-08-13 NOTE — Progress Notes (Signed)
   Subjective:    Patient ID: Michael Lucero, male    DOB: 31-Oct-1985, 31 y.o.   MRN: [id number]  HPI 6 days ago he developed chest congestion, sore throat, nasal congestion and cough that became slightly productive. In the last several days he has gotten better. Last night he developed diarrhea and slight sweats with cramping but no nausea or vomiting. Presently he is feeling better.   Review of Systems     Objective:   Physical Exam Alert and in no distress. Tympanic membranes and canals are normal. Pharyngeal area is normal. Neck is supple without adenopathy or thyromegaly. Cardiac exam shows a regular sinus rhythm without murmurs or gallops. Lungs are clear to auscultation. Abdominal exam shows normal bowel sounds without masses or tenderness.       Assessment & Plan:  Viral upper respiratory tract infection  Gastroenteritis Since he is getting over the URI, no intervention needed there. Explained that he probably then got up viral gastroenteritis and recommended supportive care for that. He is comfortable with this.

## 2016-10-22 ENCOUNTER — Other Ambulatory Visit (HOSPITAL_BASED_OUTPATIENT_CLINIC_OR_DEPARTMENT_OTHER): Payer: BC Managed Care – PPO

## 2016-10-22 LAB — CBC WITH DIFFERENTIAL/PLATELET
BASO%: 0.6 % (ref 0.0–2.0)
Basophils Absolute: 0 10*3/uL (ref 0.0–0.1)
EOS ABS: 0.3 10*3/uL (ref 0.0–0.5)
EOS%: 5.3 % (ref 0.0–7.0)
HCT: 40.1 % (ref 38.4–49.9)
HEMOGLOBIN: 14.2 g/dL (ref 13.0–17.1)
LYMPH#: 2.5 10*3/uL (ref 0.9–3.3)
LYMPH%: 42.2 % (ref 14.0–49.0)
MCH: 29.6 pg (ref 27.2–33.4)
MCHC: 35.4 g/dL (ref 32.0–36.0)
MCV: 83.6 fL (ref 79.3–98.0)
MONO#: 0.5 10*3/uL (ref 0.1–0.9)
MONO%: 9.2 % (ref 0.0–14.0)
NEUT%: 42.7 % (ref 39.0–75.0)
NEUTROS ABS: 2.5 10*3/uL (ref 1.5–6.5)
Platelets: 239 10*3/uL (ref 140–400)
RBC: 4.79 10*6/uL (ref 4.20–5.82)
RDW: 13.9 % (ref 11.0–14.6)
WBC: 5.8 10*3/uL (ref 4.0–10.3)

## 2016-10-22 LAB — IRON AND TIBC
%SAT: 29 % (ref 20–55)
IRON: 89 ug/dL (ref 42–163)
TIBC: 305 ug/dL (ref 202–409)
UIBC: 216 ug/dL (ref 117–376)

## 2016-10-22 LAB — FERRITIN: Ferritin: 41 ng/ml (ref 22–316)

## 2016-10-25 ENCOUNTER — Telehealth: Payer: Self-pay | Admitting: *Deleted

## 2016-10-25 NOTE — Telephone Encounter (Signed)
Spoke with pt and informed pt of iron level good, no need for phlebotomy at this time as per Dr. Mosetta Putt.  Pt voiced understanding.

## 2016-10-25 NOTE — Telephone Encounter (Signed)
-----   Message from Malachy Mood, MD sent at 10/25/2016 11:01 AM EDT -----   ----- Message ----- From: Malachy Mood, MD Sent: 10/24/2016   3:40 PM To: [facility] [address]  Let pt know that his iron level is good, no need phlebotomy this time. Thanks  Malachy Mood  10/24/2016

## 2017-01-29 ENCOUNTER — Emergency Department (HOSPITAL_COMMUNITY)
Admission: EM | Admit: 2017-01-29 | Discharge: 2017-01-29 | Disposition: A | Discharge status: Home / Self Care | Payer: BC Managed Care – PPO | Attending: Emergency Medicine | Admitting: Emergency Medicine

## 2017-01-29 ENCOUNTER — Encounter (HOSPITAL_COMMUNITY): Payer: Self-pay | Admitting: Emergency Medicine

## 2017-01-29 DIAGNOSIS — T782XXA Anaphylactic shock, unspecified, initial encounter: Secondary | ICD-10-CM | POA: Diagnosis not present

## 2017-01-29 DIAGNOSIS — T7840XA Allergy, unspecified, initial encounter: Secondary | ICD-10-CM | POA: Diagnosis present

## 2017-01-29 DIAGNOSIS — W57XXXA Bitten or stung by nonvenomous insect and other nonvenomous arthropods, initial encounter: Secondary | ICD-10-CM | POA: Insufficient documentation

## 2017-01-29 MED ORDER — SODIUM CHLORIDE 0.9 % IV BOLUS (SEPSIS)
1000.0000 mL | Freq: Once | INTRAVENOUS | Status: AC
  Administered 2017-01-29: 1000 mL via INTRAVENOUS

## 2017-01-29 MED ORDER — EPINEPHRINE 0.3 MG/0.3ML IJ SOAJ: 0.3000 mg | INTRAMUSCULAR | 0 refills | Status: DC | PRN

## 2017-01-29 MED ORDER — METHYLPREDNISOLONE SODIUM SUCC 125 MG IJ SOLR
125.0000 mg | Freq: Once | INTRAMUSCULAR | Status: AC
  Administered 2017-01-29: 125 mg via INTRAVENOUS
  Filled 2017-01-29: qty 2

## 2017-01-29 MED ORDER — EPINEPHRINE 0.3 MG/0.3ML IJ SOAJ
0.3000 mg | Freq: Once | INTRAMUSCULAR | Status: AC
  Administered 2017-01-29: 0.3 mg via INTRAMUSCULAR

## 2017-01-29 MED ORDER — FAMOTIDINE IN NACL 20-0.9 MG/50ML-% IV SOLN
20.0000 mg | Freq: Once | INTRAVENOUS | Status: AC
  Administered 2017-01-29: 20 mg via INTRAVENOUS
  Filled 2017-01-29: qty 50

## 2017-01-29 MED ORDER — EPINEPHRINE 0.3 MG/0.3ML IJ SOAJ
INTRAMUSCULAR | Status: AC
  Administered 2017-01-29: 0.3 mg via INTRAMUSCULAR
  Filled 2017-01-29: qty 0.3

## 2017-01-29 MED ORDER — EPINEPHRINE 0.3 MG/0.3ML IJ SOAJ
0.3000 mg | INTRAMUSCULAR | 0 refills | Status: DC | PRN
Start: 2017-01-29 — End: 2020-03-14

## 2017-01-29 NOTE — ED Provider Notes (Signed)
[facility]-EMERGENCY DEPT Provider Note   CSN: [id number] Arrival date & time: 01/29/17  1547     History   Chief Complaint No chief complaint on file.   HPI Michael Lucero is a 31 y.o. male.  HPI   31yM with allergic reaction. Out on porch and stung by what he thinks was a yellow jacket about 10-15 minutes prior to me seeing him. Began itching. Abdominal pain. Nausea. No vomiting or diarrhea. Coughing. No wheezing. Lightheaded. Feels like mouth is tight. Took two benadryl prior to arrival. No hx of similar reaction. Has been stung by yellow jackets previously w/o symptoms. No other exposures that he is aware of.   Past Medical History:  Diagnosis Date  . Allergy   . Childhood asthma   . Hemochromatosis 2016  . Hyperlipidemia   . Vision problem    wears contacts    Patient Active Problem List   Diagnosis Date Noted  . Encounter for health maintenance examination in adult 02/04/2016  . Screening for tuberculosis 02/04/2016  . Hemochromatosis 08/21/2014  . Dyslipidemia 06/12/2014  . Family history of hemochromatosis 06/12/2014    Past Surgical History:  Procedure Laterality Date  . SHOULDER SURGERY     x 3 surgeries, 2 for labral tear; right shoulder  . WISDOM TOOTH EXTRACTION  01/2013       Home Medications    Prior to Admission medications   Medication Sig Start Date End Date Taking? Authorizing Provider  atorvastatin (LIPITOR) 20 MG tablet TAKE 1 TABLET BY MOUTH DAILY Patient not taking: Reported on 08/13/2016 07/03/14   Tysinger, Kermit Baloavid S, PA-C    Family History Family History  Problem Relation Age of Onset  . Diabetes Father   . Heart disease Father        CABG age 32yo  . Cancer Father         neuroendocrine tumor of stomach  . Other Father        died post surgery with organ failure after tumor removal  . Hemochromatosis Mother        CYS282Y gene  . Hemochromatosis Maternal Uncle   . Hemochromatosis Maternal Grandmother   . Stroke Neg Hx   .  Hypertension Neg Hx   . Hyperlipidemia Neg Hx     Social History Social History  Substance Use Topics  . Smoking status: Never Smoker  . Smokeless tobacco: Never Used  . Alcohol use 3.0 oz/week    3 Glasses of wine, 2 Cans of beer per week     Allergies   Patient has no known allergies.   Review of Systems Review of Systems  All systems reviewed and negative, other than as noted in HPI.  Physical Exam Updated Vital Signs There were no vitals taken for this visit.  Physical Exam  Constitutional: He appears well-developed and well-nourished.  Laying flat in bed. Looks somewhat uncomfortable/anxious.   HENT:  Head: Normocephalic and atraumatic.  Eyes: Conjunctivae are normal. Right eye exhibits no discharge. Left eye exhibits no discharge.  Neck: Neck supple.  Cardiovascular: Normal rate, regular rhythm and normal heart sounds.  Exam reveals no gallop and no friction rub.   No murmur heard. Pulmonary/Chest: Effort normal and breath sounds normal. No respiratory distress.  Abdominal: Soft. He exhibits no distension. There is tenderness.  Mild diffuse tenderness w/o rebound or guarding. Soft.   Musculoskeletal: He exhibits no edema or tenderness.  Neurological: He is alert.  Skin: Skin is warm and dry. Rash noted.  Diffuse  urticarial rash  Psychiatric: His behavior is normal. Thought content normal.  Somewhat anxious  Nursing note and vitals reviewed.    ED Treatments / Results  Labs (all labs ordered are listed, but only abnormal results are displayed) Labs Reviewed - No data to display  EKG  EKG Interpretation  Date/Time:  Saturday January 29 2017 17:28:10 EDT Ventricular Rate:  85 PR Interval:    QRS Duration: 101 QT Interval:  372 QTC Calculation: 443 R Axis:   101 Text Interpretation:  Sinus rhythm Right axis deviation No old tracing to compare Confirmed by Mancel Bale [phone]) on 01/29/2017 10:49:26 PM       Radiology No results  found.  Procedures Procedures (including critical care time)  CRITICAL CARE Performed by: Raeford Razor Total critical care time: 35 minutes Critical care time was exclusive of separately billable procedures and treating other patients. Critical care was necessary to treat or prevent imminent or life-threatening deterioration. Critical care was time spent personally by me on the following activities: development of treatment plan with patient and/or surrogate as well as nursing, discussions with consultants, evaluation of patient's response to treatment, examination of patient, obtaining history from patient or surrogate, ordering and performing treatments and interventions, ordering and review of laboratory studies, ordering and review of radiographic studies, pulse oximetry and re-evaluation of patient's condition.   Medications Ordered in ED Medications  EPINEPHrine (EPI-PEN) 0.3 mg/0.3 mL injection (not administered)  sodium chloride 0.9 % bolus 1,000 mL (not administered)  famotidine (PEPCID) IVPB 20 mg premix (not administered)  methylPREDNISolone sodium succinate (SOLU-MEDROL) 125 mg/2 mL injection 125 mg (not administered)  EPINEPHrine (EPI-PEN) injection 0.3 mg (not administered)     Initial Impression / Assessment and Plan / ED Course  I have reviewed the triage vital signs and the nursing notes.  Pertinent labs & imaging results that were available during my care of the patient were reviewed by me and considered in my medical decision making (see chart for details).  Clinical Course as of Jan 30 1619  Sat Jan 29, 2017  1618 Feeling better but still having some abdominal pain. Nausea better. Declining pain meds. Remains normotensive. O2 sat 92-94% but remains clear. Speaks in complete sentences and generally appears more comfortable than initial exam.   [SK]    Clinical Course User Index [SK] Raeford Razor, MD    31yM with anaphylaxis. Urticarial rash, abdominal pain,  nausea. Mild hypoxemia although I can't say I appreciate wheezing on exam. Benadryl prior to arrival. IM epi. IV established. IVF. Initial BP ok. Will continue to monitor closely and intervene as needed.   Final Clinical Impressions(s) / ED Diagnoses   Final diagnoses:  Anaphylaxis, initial encounter    New Prescriptions New Prescriptions   No medications on file     Raeford Razor, MD 02/09/17 1943

## 2017-01-29 NOTE — ED Triage Notes (Signed)
Pt presents with hives all over his arms and abdomen after being stung by a yellow jacket 10 minutes before arrival.  Pt reports mouth tightness and arm tightness.  Eyes swollen and red upon arrival.  Took 2 benadryl before arrival.  Ambulatory.  Airway intact.

## 2017-01-29 NOTE — ED Notes (Signed)
Pt ambulatory and independent at discharge.  Verbalized understanding of discharge instructions 

## 2017-02-18 ENCOUNTER — Ambulatory Visit (HOSPITAL_COMMUNITY)
Admission: RE | Admit: 2017-02-18 | Discharge: 2017-02-18 | Disposition: A | Discharge status: Home / Self Care | Payer: BC Managed Care – PPO | Source: Ambulatory Visit | Attending: Hematology | Admitting: Hematology

## 2017-02-22 NOTE — Progress Notes (Signed)
[facility]  Telephone:(336) [phone] Fax:(336) [phone]  Clinic Follow Up Note   Patient Care Team: Tysinger, Cleda Mccreedyavid S, PA-C as PCP - General (Family Medicine) Jeani HawkingHung, Patrick, MD as Consulting Physician (Gastroenterology) 02/25/2017  CHIEF COMPLAINTs:  Follow up hereditary Hemachromatosis  HISTORY OF PRESENTING ILLNESS (08/21/2014):  Michael DryKevin Lucero 31 y.o. male is here because of newly diagnosed hereditary hemochromatosis.  He has strong family history of hemochromatosis, with his maternal grandmother father, maternal uncle and his mother who was recently diagnosed with hemochromatosis. His father died of neuroendocrine tumor of stomach, but he was also diagnosed of his coronary artery disease, type 2 diabetes and a thyroidectomy. He was recently tested for HFE gene and was found to have C282Y/C282Y mutation. His lab showed fasting blood glucose 103, ferritin 774, iron saturation 69%, hemoglobin 16, AST 34, ALT 59.    He feels well overall, and has no complaints. 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 He exercise 4 times a week and has a very physical active lifestyle. He denies any chest pain, shortness breath on exertion, abdominal discomfort, or any recent weight change.  CURRENT THERAPY: Phlebotomy if ferritin > 50 or transferrin saturation >50%   INTERIM HISTORY: Michael Lucero returns for follow-up. Overall things are going well for him. He has been donating blood every three months as he needs to. He states that he will know he when he is due to have one as his vision will blur slightly. His last donation was 3wks ago. He has not required phlebotomy since August of 2016.   MEDICAL HISTORY:  Past Medical History:  Diagnosis Date  . Allergy   . Childhood asthma   . Hemochromatosis 2016  . Hyperlipidemia   . Vision problem    wears contacts   SURGICAL HISTORY: Past Surgical History:  Procedure Laterality Date  . SHOULDER  SURGERY     x 3 surgeries, 2 for labral tear; right shoulder  . WISDOM TOOTH EXTRACTION  01/2013    SOCIAL HISTORY: Social History   Social History  . Marital status: Married    Spouse name: N/A  . Number of children: N/A  . Years of education: N/A   Occupational History  . football coach [employer]   Social History Main Topics  . Smoking status: Never Smoker  . Smokeless tobacco: Never Used  . Alcohol use 3.0 oz/week    3 Glasses of wine, 2 Cans of beer per week  . Drug use: No  . Sexual activity: Not on file   Other Topics Concern  . Not on file   Social History Narrative   Married 12/20/15.  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  was teaching biology and anatomy, [employer].  Was full time coach at [employer] prior.  Bought house 05/2014.    FAMILY HISTORY: Family History  Problem Relation Age of Onset  . Diabetes Father   . Heart disease Father        CABG age 32yo  . Cancer Father         neuroendocrine tumor of stomach  . Other Father        died post surgery with organ failure after tumor removal  . Hemochromatosis Mother        CYS282Y gene  . Hemochromatosis Maternal Uncle   .  Hemochromatosis Maternal Grandmother   . Stroke Neg Hx   . Hypertension Neg Hx   . Hyperlipidemia Neg Hx     ALLERGIES:  is allergic to bee venom.  MEDICATIONS:  Current Outpatient Prescriptions  Medication Sig Dispense Refill  . folic acid (FOLVITE) 400 MCG tablet Take 400 mcg by mouth daily.    . vitamin E 400 UNIT capsule Take 400 Units by mouth daily.    Marland Kitchen zinc gluconate 50 MG tablet Take 50 mg by mouth daily.    Marland Kitchen EPINEPHrine (EPIPEN 2-PAK) 0.3 mg/0.3 mL IJ SOAJ injection Inject 0.3 mLs (0.3 mg total) into the muscle as needed. (Patient not taking: Reported on 02/25/2017) 2 Device 0   No current facility-administered medications  for this visit.     REVIEW OF SYSTEMS:   Constitutional: Denies fevers, chills or abnormal night sweats Eyes: Denies blurriness of vision, double vision or watery eyes Ears, nose, mouth, throat, and face: Denies mucositis or sore throat Respiratory: Denies cough, dyspnea or wheezes Cardiovascular: Denies palpitation, chest discomfort or lower extremity swelling Gastrointestinal:  Denies nausea, heartburn or change in bowel habits Skin: Denies abnormal skin rashes Lymphatics: Denies new lymphadenopathy or easy bruising Neurological:Denies numbness, tingling or new weaknesses Behavioral/Psych: Mood is stable, no new changes  All other systems were reviewed with the patient and are negative.  PHYSICAL EXAMINATION:  ECOG PERFORMANCE STATUS: 0 - Asymptomatic  Vitals:   02/25/17 1330  BP: 123/69  Pulse: 71  Resp: 20  Temp: 98.2 F (36.8 C)  SpO2: 99%   Filed Weights   02/25/17 1330  Weight: 262 lb 1.6 oz (118.9 kg)    GENERAL:alert, no distress and comfortable SKIN: skin color, texture, turgor are normal, no rashes or significant lesions EYES: normal, conjunctiva are pink and non-injected, sclera clear OROPHARYNX:no exudate, no erythema and lips, buccal mucosa, and tongue normal  NECK: supple, thyroid normal size, non-tender, without nodularity LYMPH:  no palpable lymphadenopathy in the cervical, axillary or inguinal LUNGS: clear to auscultation and percussion with normal breathing effort HEART: regular rate & rhythm and no murmurs and no lower extremity edema ABDOMEN:abdomen soft, non-tender and normal bowel sounds Musculoskeletal:no cyanosis of digits and no clubbing  PSYCH: alert & oriented x 3 with fluent speech NEURO: no focal motor/sensory deficits  LABORATORY DATA:  I have reviewed the data as listed CBC Latest Ref Rng & Units 02/25/2017 10/22/2016 06/25/2016  WBC 4.0 - 10.3 10e3/uL 8.5 5.8 5.5  Hemoglobin 13.0 - 17.1 g/dL 21.3 08.6 57.8  Hematocrit 38.4 - 49.9 %  41.0 40.1 44.7  Platelets 140 - 400 10e3/uL 236 239 210    CMP Latest Ref Rng & Units 02/25/2017 06/25/2016 11/17/2015  Glucose 70 - 140 mg/dl 469 629 97  BUN 7.0 - 52.8 mg/dL 41.3 24.4 01.0  Creatinine 0.7 - 1.3 mg/dL 1.1 1.3 1.1  Sodium 272 - 145 mEq/L 138 137 136  Potassium 3.5 - 5.1 mEq/L 4.0 4.5 4.2  Chloride 96 - 112 mEq/L - - -  CO2 22 - 29 mEq/L 26 24 24   Calcium 8.4 - 10.4 mg/dL 9.7 9.2 9.5  Total Protein 6.4 - 8.3 g/dL 7.2 7.2 7.0  Total Bilirubin 0.20 - 1.20 mg/dL 5.36 6.44 0.34  Alkaline Phos 40 - 150 U/L 80 72 62  AST 5 - 34 U/L 16 23 24   ALT 0 - 55 U/L 28 38 34   Results for ARRIAN, MANSON (MRN [id number]) as of 02/25/2017 23:19  Ref. Range 10/22/2016 08:17 10/22/2016 08:17  02/25/2017 13:13  Iron Latest Ref Range: 42 - 163 ug/dL  89 49  UIBC Latest Ref Range: 117 - 376 ug/dL  161 096  TIBC Latest Ref Range: 202 - 409 ug/dL  045 409  %SAT Latest Ref Range: 20 - 55 %  29 18 (L)  Ferritin Latest Ref Range: 22 - 316 ng/ml 41  46     RADIOGRAPHIC STUDIES: I have personally reviewed the radiological images as listed and agreed with the findings in the report.  US ABDOMEN 02/18/17 IMPRESSION: Mildly increased echogenicity of hepatic parenchyma is noted suggesting fatty infiltration or other diffuse hepatocellular disease. No other abnormality seen in the right upper quadrant of the abdomen.  US ABDOMEN 09/02/2014 IMPRESSION: Multiple small echogenic foci throughout the liver. While these could reflect multiple small hemangiomas, these are nonspecific. Further evaluation with MRI with and without contrast is Recommended.  ECHO Study Conclusions 09/04/2014 - Left ventricle: The cavity size was normal. Systolic function was normal. Wall motion was normal; there were no regional wall motion abnormalities. Left ventricular diastolic function parameters were normal. - Left atrium: The atrium was mildly dilated.  Liver MRI 11/06/2014  IMPRESSION: Negative MRI  abdomen. Specifically, the liver is within normal limits.  ASSESSMENT & PLAN:  31 year old Caucasian male  1. 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 He was tested positive for HFE mutation. His ferritin level was 774 at presentation. -We discussed the natural history of hemochromatosis, complications without treatments, including but not limited to, liver failure and cirrhosis, diabetes, congestive heart failure, hypothyroidism, hypopituitarism, etc. He has slightly elevated liver enzyme and fasting glucose. -I reviewed his imaging study results. His echo was normal. Ultrasound showed multiple small echogenic foci through the liver, possible hemangioma. I'll obtain a abdominal MRI with and without contrast to further investigate.  -His TSH and hemoglobin A1c were normal. AFP was normal. -We discussed the treatment options for hemochromatosis, mainly therapeutic phlebotomy, with a goal of keeping ferritin below 50 and transferrin<50%. He agrees with the plan -We'll also discussed screening hemochromatosis in his children in the future -He has been donating blood regularly, every 2-3 month, his ferritin has been in the 20-30, transferrin saturation <50%, no evidence of iron overload. -We'll monitor his labs every 4 months and phlebotomy if ferritin more than 50 or iron saturation >50%.  -We'll monitor his liver function every 6-12 months, AFP once a year and liver ultrasound every 2-3 years -CBC today was stable. Ferritin 46, transferrin saturation 18%, no need for phlebotomy. -Reviewed US findings w/ him; some fatty liver, otherwise normal.  -He will continue blood donation every 2-3 months at the [facility].  2. Liver lesion on [place] -We obtained a liver MRI in 10/2014, which was normal  Plan: -CBC is normal at this time. Iron level controlled, no need for phlebotomy for now -He  will continue blood donation every 2-3 months  -Labs every 24mo x3 -Lab and f/u in 40mo.   All questions were answered. The patient knows to call the clinic with any problems, questions or concerns. I spent 15 minutes counseling the patient face to face. The total time spent in the appointment was 15 minutes and more than 50% was on counseling.     Malachy Mood, MD 02/25/2017

## 2017-02-25 ENCOUNTER — Encounter: Payer: Self-pay | Admitting: Hematology

## 2017-02-25 ENCOUNTER — Other Ambulatory Visit (HOSPITAL_BASED_OUTPATIENT_CLINIC_OR_DEPARTMENT_OTHER): Payer: BC Managed Care – PPO

## 2017-02-25 ENCOUNTER — Telehealth: Payer: Self-pay

## 2017-02-25 ENCOUNTER — Ambulatory Visit (HOSPITAL_BASED_OUTPATIENT_CLINIC_OR_DEPARTMENT_OTHER): Payer: BC Managed Care – PPO | Admitting: Hematology

## 2017-02-25 LAB — CBC WITH DIFFERENTIAL/PLATELET
BASO%: 0.5 % (ref 0.0–2.0)
BASOS ABS: 0 10*3/uL (ref 0.0–0.1)
EOS ABS: 0.2 10*3/uL (ref 0.0–0.5)
EOS%: 2.9 % (ref 0.0–7.0)
HCT: 41 % (ref 38.4–49.9)
HGB: 14.3 g/dL (ref 13.0–17.1)
LYMPH%: 25.7 % (ref 14.0–49.0)
MCH: 29.1 pg (ref 27.2–33.4)
MCHC: 34.9 g/dL (ref 32.0–36.0)
MCV: 83.4 fL (ref 79.3–98.0)
MONO#: 0.7 10*3/uL (ref 0.1–0.9)
MONO%: 8.4 % (ref 0.0–14.0)
NEUT#: 5.3 10*3/uL (ref 1.5–6.5)
NEUT%: 62.5 % (ref 39.0–75.0)
PLATELETS: 236 10*3/uL (ref 140–400)
RBC: 4.92 10*6/uL (ref 4.20–5.82)
RDW: 13.7 % (ref 11.0–14.6)
WBC: 8.5 10*3/uL (ref 4.0–10.3)
lymph#: 2.2 10*3/uL (ref 0.9–3.3)

## 2017-02-25 LAB — COMPREHENSIVE METABOLIC PANEL
ALT: 28 U/L (ref 0–55)
AST: 16 U/L (ref 5–34)
Albumin: 3.9 g/dL (ref 3.5–5.0)
Alkaline Phosphatase: 80 U/L (ref 40–150)
Anion Gap: 7 mEq/L (ref 3–11)
BUN: 18.2 mg/dL (ref 7.0–26.0)
CHLORIDE: 105 meq/L (ref 98–109)
CO2: 26 meq/L (ref 22–29)
CREATININE: 1.1 mg/dL (ref 0.7–1.3)
Calcium: 9.7 mg/dL (ref 8.4–10.4)
EGFR: >90 mL/min/{1.73_m2} (ref >90)
GLUCOSE: 102 mg/dL (ref 70–140)
Potassium: 4 mEq/L (ref 3.5–5.1)
SODIUM: 138 meq/L (ref 136–145)
Total Bilirubin: 0.55 mg/dL (ref 0.20–1.20)
Total Protein: 7.2 g/dL (ref 6.4–8.3)

## 2017-02-25 LAB — FERRITIN: FERRITIN: 46 ng/mL (ref 22–316)

## 2017-02-25 LAB — IRON AND TIBC
%SAT: 18 % — ABNORMAL LOW (ref 20–55)
Iron: 49 ug/dL (ref 42–163)
TIBC: 279 ug/dL (ref 202–409)
UIBC: 230 ug/dL (ref 117–376)

## 2017-02-25 NOTE — Telephone Encounter (Signed)
Printed avs and calender for upcoming appointment per los

## 2017-03-04 ENCOUNTER — Telehealth: Payer: Self-pay | Admitting: Emergency Medicine

## 2017-03-04 NOTE — Telephone Encounter (Signed)
Called patient per Dr.Feng to inform him of normal iron levels. Patient verbalized understanding.

## 2017-06-27 ENCOUNTER — Other Ambulatory Visit (HOSPITAL_BASED_OUTPATIENT_CLINIC_OR_DEPARTMENT_OTHER): Payer: BC Managed Care – PPO

## 2017-06-27 LAB — FERRITIN: Ferritin: 31 ng/ml (ref 22–316)

## 2017-06-27 LAB — CBC WITH DIFFERENTIAL/PLATELET
BASO%: 0.4 % (ref 0.0–2.0)
Basophils Absolute: 0 10*3/uL (ref 0.0–0.1)
EOS%: 3.5 % (ref 0.0–7.0)
Eosinophils Absolute: 0.2 10*3/uL (ref 0.0–0.5)
HEMATOCRIT: 41.8 % (ref 38.4–49.9)
HEMOGLOBIN: 15.2 g/dL (ref 13.0–17.1)
LYMPH#: 2 10*3/uL (ref 0.9–3.3)
LYMPH%: 37.4 % (ref 14.0–49.0)
MCH: 29.9 pg (ref 27.2–33.4)
MCHC: 36.4 g/dL — ABNORMAL HIGH (ref 32.0–36.0)
MCV: 82.1 fL (ref 79.3–98.0)
MONO#: 0.3 10*3/uL (ref 0.1–0.9)
MONO%: 6.3 % (ref 0.0–14.0)
NEUT%: 52.4 % (ref 39.0–75.0)
NEUTROS ABS: 2.8 10*3/uL (ref 1.5–6.5)
PLATELETS: 208 10*3/uL (ref 140–400)
RBC: 5.09 10*6/uL (ref 4.20–5.82)
RDW: 12.8 % (ref 11.0–14.6)
WBC: 5.4 10*3/uL (ref 4.0–10.3)

## 2017-06-28 LAB — AFP TUMOR MARKER: AFP, SERUM, TUMOR MARKER: 2 ng/mL (ref 0.0–8.3)

## 2017-06-30 ENCOUNTER — Telehealth: Payer: Self-pay | Admitting: *Deleted

## 2017-06-30 NOTE — Telephone Encounter (Signed)
Called pt & informed of normal labs & no need for phlebotomy per Dr Latanya MaudlinFeng's message.  Pt expressed understanding.

## 2017-06-30 NOTE — Telephone Encounter (Signed)
-----   Message from Malachy MoodYan Feng, MD sent at 06/27/2017  7:26 PM EST ----- Please let pt know the lab result, no need for phlebotomy this time, thanks  Malachy MoodFeng, Yan  06/27/2017

## 2017-10-25 ENCOUNTER — Other Ambulatory Visit: Payer: Self-pay | Admitting: *Deleted

## 2017-10-26 ENCOUNTER — Inpatient Hospital Stay: Payer: BC Managed Care – PPO | Attending: Hematology

## 2017-10-26 ENCOUNTER — Other Ambulatory Visit: Payer: Self-pay | Admitting: Hematology

## 2017-10-26 LAB — CBC WITH DIFFERENTIAL (CANCER CENTER ONLY)
Basophils Absolute: 0 10*3/uL (ref 0.0–0.1)
Basophils Relative: 1 %
EOS ABS: 0.2 10*3/uL (ref 0.0–0.5)
Eosinophils Relative: 4 %
HEMATOCRIT: 44.8 % (ref 38.4–49.9)
HEMOGLOBIN: 16.3 g/dL (ref 13.0–17.1)
LYMPHS ABS: 2.1 10*3/uL (ref 0.9–3.3)
Lymphocytes Relative: 37 %
MCH: 29.7 pg (ref 27.2–33.4)
MCHC: 36.4 g/dL — AB (ref 32.0–36.0)
MCV: 81.6 fL (ref 79.3–98.0)
MONO ABS: 0.3 10*3/uL (ref 0.1–0.9)
MONOS PCT: 6 %
Neutro Abs: 2.9 10*3/uL (ref 1.5–6.5)
Neutrophils Relative %: 52 %
Platelet Count: 214 10*3/uL (ref 140–400)
RBC: 5.49 MIL/uL (ref 4.20–5.82)
RDW: 12.5 % (ref 11.0–14.6)
WBC Count: 5.6 10*3/uL (ref 4.0–10.3)

## 2017-10-26 LAB — COMPREHENSIVE METABOLIC PANEL
ALBUMIN: 4.1 g/dL (ref 3.5–5.0)
ALK PHOS: 73 U/L (ref 40–150)
ALT: 31 U/L (ref 0–55)
ANION GAP: 8 (ref 3–11)
AST: 18 U/L (ref 5–34)
BILIRUBIN TOTAL: 0.6 mg/dL (ref 0.2–1.2)
BUN: 19 mg/dL (ref 7–26)
CALCIUM: 9.9 mg/dL (ref 8.4–10.4)
CO2: 24 mmol/L (ref 22–29)
CREATININE: 1.03 mg/dL (ref 0.70–1.30)
Chloride: 105 mmol/L (ref 98–109)
GFR calc Af Amer: >60 mL/min (ref >60)
GFR calc non Af Amer: >60 mL/min (ref >60)
Glucose, Bld: 126 mg/dL (ref 70–140)
Potassium: 4.3 mmol/L (ref 3.5–5.1)
SODIUM: 137 mmol/L (ref 136–145)
TOTAL PROTEIN: 7.6 g/dL (ref 6.4–8.3)

## 2017-10-26 LAB — IRON AND TIBC
IRON: 128 ug/dL (ref 42–163)
Saturation Ratios: 42 % (ref 42–163)
TIBC: 305 ug/dL (ref 202–409)
UIBC: 177 ug/dL

## 2017-10-28 LAB — FERRITIN: FERRITIN: 40 ng/mL (ref 22–316)

## 2017-11-02 ENCOUNTER — Telehealth: Payer: Self-pay

## 2017-11-02 NOTE — Telephone Encounter (Signed)
-----   Message from Malachy MoodYan Feng, MD sent at 10/31/2017  8:43 AM EDT ----- Please let pt know his iron level is good, no need phlebotomy this time, thanks  Malachy MoodYan Feng  10/31/2017

## 2017-11-02 NOTE — Telephone Encounter (Signed)
Left voice message for patient, per Dr. Mosetta PuttFeng iron level is good at this time, no need for phlebotomy.

## 2017-12-19 ENCOUNTER — Ambulatory Visit: Payer: BC Managed Care – PPO | Admitting: Medical

## 2017-12-21 ENCOUNTER — Encounter: Payer: Self-pay | Admitting: Medical

## 2017-12-21 ENCOUNTER — Ambulatory Visit: Payer: BC Managed Care – PPO | Admitting: Medical

## 2017-12-21 VITALS — BP 114/78 | HR 63 | Temp 98.0°F | Ht 72.0 in | Wt 269.0 lb

## 2017-12-21 DIAGNOSIS — E785 Hyperlipidemia, unspecified: Secondary | ICD-10-CM

## 2017-12-21 DIAGNOSIS — Z131 Encounter for screening for diabetes mellitus: Secondary | ICD-10-CM

## 2017-12-21 DIAGNOSIS — Z0000 Encounter for general adult medical examination without abnormal findings: Secondary | ICD-10-CM | POA: Diagnosis not present

## 2017-12-21 DIAGNOSIS — Z8349 Family history of other endocrine, nutritional and metabolic diseases: Secondary | ICD-10-CM | POA: Diagnosis not present

## 2017-12-21 LAB — POCT URINALYSIS DIP (PROADVANTAGE DEVICE)
BILIRUBIN UA: NEGATIVE mg/dL
Bilirubin, UA: NEGATIVE
Blood, UA: NEGATIVE
GLUCOSE UA: NEGATIVE mg/dL
Leukocytes, UA: NEGATIVE
Nitrite, UA: NEGATIVE
Protein Ur, POC: NEGATIVE mg/dL
pH, UA: 6 (ref 5.0–8.0)

## 2017-12-21 NOTE — Progress Notes (Signed)
Subjective:   HPI  Michael Lucero is a 32 y.o. male who presents for a complete physical.   Medical team:  Dr. Malachy MoodYan Feng, hematology  Sascha Palma, Kermit Baloavid S, PA-C here for primary care  Sees eye doctor, dentist  Concerns: None.  Sees hematology regularly, has labs and phlebotomy regularly for hemachromatosis.     He and wife, although busy are working towards their doctorate.   Wife is Camera operatorWellness coordinator at [employer].  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  Reviewed their medical, surgical, family, social, medication, and allergy history and updated chart as appropriate.  Past Medical History:  Diagnosis Date  . Allergy   . Childhood asthma   . Hemochromatosis 2016  . Hyperlipidemia   . Vision problem    wears contacts    Past Surgical History:  Procedure Laterality Date  . SHOULDER SURGERY     x 3 surgeries, 2 for labral tear; right shoulder  . WISDOM TOOTH EXTRACTION  01/2013    Social History   Socioeconomic History  . Marital status: Married    Spouse name: Not on file  . Number of children: Not on file  . Years of education: Not on file  . Highest education level: Not on file  Occupational History  . Occupation: football Acupuncturistcoach    Employer: [employer]  Social Needs  . Financial resource strain: Not on file  . Food insecurity:    Worry: Not on file    Inability: Not on file  . Transportation needs:    Medical: Not on file    Non-medical: Not on file  Tobacco Use  . Smoking status: Never Smoker  . Smokeless tobacco: Never Used  Substance and Sexual Activity  . Alcohol use: Yes    Alcohol/week: 3.0 oz    Types: 3 Glasses of wine, 2 Cans of beer per week  . Drug use: No  . Sexual activity: Not on file  Lifestyle  . Physical activity:    Days per week: Not on file    Minutes per session: Not on file  . Stress: Not on file  Relationships  . Social connections:    Talks on phone: Not on file    Gets together: Not on file    Attends religious  service: Not on file    Active member of club or organization: Not on file    Attends meetings of clubs or organizations: Not on file    Relationship status: Not on file  . Intimate partner violence:    Fear of current or ex partner: Not on file    Emotionally abused: Not on file    Physically abused: Not on file    Forced sexual activity: Not on file  Other Topics Concern  . Not on file  Social History Narrative   Married 12/20/15.  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  Bought house 05/2014.    Family History  Problem Relation Age of Onset  . Diabetes Father   . Heart disease Father        CABG age 33yo  . Cancer Father         neuroendocrine tumor of stomach  . Other Father        died post  surgery with organ failure after tumor removal  . Hemochromatosis Mother        CYS282Y gene  . Hemachromatosis Mother   . Hemochromatosis Maternal Uncle   . Hemochromatosis Maternal Grandmother   . Stroke Neg Hx   . Hypertension Neg Hx   . Hyperlipidemia Neg Hx      Current Outpatient Medications:  .  EPINEPHrine (EPIPEN 2-PAK) 0.3 mg/0.3 mL IJ SOAJ injection, Inject 0.3 mLs (0.3 mg total) into the muscle as needed., Disp: 2 Device, Rfl: 0  Allergies  Allergen Reactions  . Bee Venom Anaphylaxis     Review of Systems Constitutional: -fever, -chills, -sweats, -unexpected weight change, -decreased appetite, -fatigue Allergy: -sneezing, -itching, -congestion Dermatology: -changing moles, --rash, -lumps ENT: -runny nose, -ear pain, -sore throat, -hoarseness, -sinus pain, -teeth pain, - ringing in ears, -hearing loss, -nosebleeds Cardiology: -chest pain, -palpitations, -swelling, -difficulty breathing when lying flat, -waking up short of breath Respiratory:  -cough, -shortness of breath, -difficulty breathing with exercise or exertion, -wheezing, -coughing up blood Gastroenterology: -abdominal pain, -nausea, -vomiting, -diarrhea, -constipation, -blood in stool, -changes in bowel movement, -difficulty swallowing or eating Hematology: -bleeding, -bruising  Musculoskeletal: -joint aches, -muscle aches, -joint swelling, -back pain, -neck pain, -cramping, -changes in gait Ophthalmology: denies vision changes, eye redness, itching, discharge Urology: -burning with urination, -difficulty urinating, -blood in urine, -urinary frequency, -urgency, -incontinence Neurology: -headache, -weakness, -tingling, -numbness, -memory loss, -falls, -dizziness Psychology: -depressed mood, -agitation, -sleep problems     Objective:   Physical Exam  BP 114/78   Pulse 63   Temp 98 F (36.7 C) (Oral)   Ht 6' (1.829 m)   Wt 269 lb (122 kg)   SpO2 98%   BMI 36.48 kg/m   General appearance: alert, no distress, WD/WN, stocky white male  Skin: right anterolateral shoulder with surgical scar, otherwise few scattered benign appearing macules, right posterolateral upper chest just distal to axilla with triangular brown red macule, no worrisome lesions HEENT: normocephalic, conjunctiva/corneas normal, sclerae anicteric, PERRLA, EOMi, nares patent, no discharge or erythema, pharynx normal  Oral cavity: MMM, tongue normal, teeth in good repair  Neck: supple, no lymphadenopathy, no thyromegaly, no masses, normal ROM, no bruits Chest: non tender, normal shape and expansion  Heart: RRR, normal S1, S2, no murmurs  Lungs: CTA bilaterally, no wheezes, rhonchi, or rales  Abdomen: +bs, soft, non tender, non distended, no masses, no hepatomegaly, no splenomegaly, no bruits  Back: non tender, normal ROM, no scoliosis  Musculoskeletal: upper extremities non tender, no obvious deformity, normal ROM throughout, lower extremities non tender, no obvious deformity, normal ROM  throughout  Extremities: no edema, no cyanosis, no clubbing  Pulses: 2+ symmetric, upper and lower extremities, normal cap refill  Neurological: alert, oriented x 3, CN2-12 intact, strength normal upper extremities and lower extremities, sensation normal throughout, DTRs 2+ throughout, no cerebellar signs, gait normal  Psychiatric: normal affect, behavior normal, pleasant  GU: normal male external genitalia, circumcised, non tender, no masses, no hernia, no lymphadenopathy  Rectal: deferred   Assessment and Plan :   Encounter Diagnoses  Name Primary?  . Encounter for health maintenance examination in adult Yes  . Dyslipidemia   . Family history of hemochromatosis   . Hereditary hemochromatosis (HCC)     Physical exam - discussed healthy lifestyle, diet, exercise, preventative care, vaccinations, and addressed their concerns.   See your eye doctor yearly for routine vision care. See your dentist yearly for routine dental care including hygiene visits twice yearly. C/t routine  hematology f/u, phlebotomy, reviewed recent hematology notes C/t monthly testicular exams Follow-up pending labs

## 2017-12-22 ENCOUNTER — Other Ambulatory Visit: Payer: Self-pay | Admitting: Medical

## 2017-12-22 LAB — HEMOGLOBIN A1C
Est. average glucose Bld gHb Est-mCnc: 108 mg/dL
Hgb A1c MFr Bld: 5.4 % (ref 4.8–5.6)

## 2017-12-22 LAB — LIPID PANEL
CHOLESTEROL TOTAL: 355 mg/dL — AB (ref 100–199)
Chol/HDL Ratio: 8.5 ratio — ABNORMAL HIGH (ref 0.0–5.0)
HDL: 42 mg/dL (ref >39)
LDL Calculated: 248 mg/dL — ABNORMAL HIGH (ref 0–99)
TRIGLYCERIDES: 325 mg/dL — AB (ref 0–149)
VLDL CHOLESTEROL CAL: 65 mg/dL — AB (ref 5–40)

## 2017-12-22 MED ORDER — ROSUVASTATIN CALCIUM 10 MG PO TABS: 10.0000 mg | ORAL_TABLET | Freq: Every day | ORAL | 2 refills | Status: DC

## 2017-12-23 ENCOUNTER — Telehealth: Payer: Self-pay

## 2017-12-23 NOTE — Telephone Encounter (Signed)
Pt called and was given his lab results. Letter was sent with results as well. Pt has requested a call back from his provider to discuss cholesterol because he is wanting to work on his diet before starting a cholesterol medication. Please advise.

## 2017-12-27 NOTE — Telephone Encounter (Signed)
I called and left a message  him to return my call.  Regarding his cholesterol, it was quite high but not just a little high.  For example the ideal LDL cholesterol should be around 70 particular given family history.  His back cholesterol was over 200, his total cholesterol was over 300.  This definitely raises his risk of heart disease.  Given his fluctuating levels over time a lot of this may be diet related, but there is also the benefit of using a statin medicine to lower the cholesterol and lower the heart disease risk in general.  I would recommend healthy diet plus medication but see what he wants to do?  Recommendations for improving lipids:  Foods TO AVOID or limit - fried foods, high sugar foods, white bread, enriched flour, fast food, red meat, large amounts of cheese, processed foods such as little debbie cakes, cookies, pies, donuts, for example  Foods TO INCLUDE in the diet - whole grains such as whole grain pasta, whole grain bread, barley, steel cut oatmeal (not instant oatmeal), avocado, fish, green leafy vegetables, nuts, increased fiber in diet, and using olive oil in small amounts for cooking or as salad dressing vinaigrette.

## 2017-12-28 ENCOUNTER — Other Ambulatory Visit: Payer: Self-pay | Admitting: Medical

## 2017-12-28 NOTE — Telephone Encounter (Signed)
Pt stated he will discuss starting the medication with his wife. Pt also stated once he make the decision he will pick up the medication from the pharmacy.

## 2017-12-28 NOTE — Telephone Encounter (Signed)
lmom asking patient to call back for a message from his provider.

## 2018-02-21 ENCOUNTER — Telehealth: Payer: Self-pay | Admitting: Hematology

## 2018-02-21 NOTE — Telephone Encounter (Signed)
Appointments rescheduled and LMVM for patient to call back and confirm per 8/7 sch msg

## 2018-02-24 ENCOUNTER — Encounter: Payer: Self-pay | Admitting: Nurse Practitioner

## 2018-02-24 ENCOUNTER — Telehealth: Payer: Self-pay | Admitting: Nurse Practitioner

## 2018-02-24 ENCOUNTER — Ambulatory Visit: Payer: BC Managed Care – PPO | Admitting: Hematology

## 2018-02-24 ENCOUNTER — Other Ambulatory Visit: Payer: BC Managed Care – PPO

## 2018-02-24 ENCOUNTER — Inpatient Hospital Stay: Payer: BC Managed Care – PPO | Attending: Hematology

## 2018-02-24 ENCOUNTER — Inpatient Hospital Stay (HOSPITAL_BASED_OUTPATIENT_CLINIC_OR_DEPARTMENT_OTHER): Payer: BC Managed Care – PPO | Admitting: Nurse Practitioner

## 2018-02-24 DIAGNOSIS — E7800 Pure hypercholesterolemia, unspecified: Secondary | ICD-10-CM | POA: Diagnosis not present

## 2018-02-24 DIAGNOSIS — K769 Liver disease, unspecified: Secondary | ICD-10-CM

## 2018-02-24 LAB — CBC WITH DIFFERENTIAL (CANCER CENTER ONLY)
BASOS PCT: 0 %
Basophils Absolute: 0 10*3/uL (ref 0.0–0.1)
EOS ABS: 0.2 10*3/uL (ref 0.0–0.5)
Eosinophils Relative: 3 %
HCT: 40.5 % (ref 38.4–49.9)
HEMOGLOBIN: 14.8 g/dL (ref 13.0–17.1)
LYMPHS ABS: 2.4 10*3/uL (ref 0.9–3.3)
Lymphocytes Relative: 35 %
MCH: 30.4 pg (ref 27.2–33.4)
MCHC: 36.5 g/dL — AB (ref 32.0–36.0)
MCV: 83.2 fL (ref 79.3–98.0)
MONOS PCT: 8 %
Monocytes Absolute: 0.5 10*3/uL (ref 0.1–0.9)
NEUTROS ABS: 3.6 10*3/uL (ref 1.5–6.5)
NEUTROS PCT: 54 %
Platelet Count: 221 10*3/uL (ref 140–400)
RBC: 4.87 MIL/uL (ref 4.20–5.82)
RDW: 12.5 % (ref 11.0–14.6)
WBC Count: 6.8 10*3/uL (ref 4.0–10.3)

## 2018-02-24 LAB — IRON AND TIBC
Iron: 69 ug/dL (ref 42–163)
Saturation Ratios: 23 % — ABNORMAL LOW (ref 42–163)
TIBC: 305 ug/dL (ref 202–409)
UIBC: 236 ug/dL

## 2018-02-24 LAB — FERRITIN: Ferritin: 24 ng/mL (ref 24–336)

## 2018-02-24 NOTE — Telephone Encounter (Signed)
Patient declined avs and calendar of upcoming appts  °

## 2018-02-24 NOTE — Progress Notes (Addendum)
  [facility] OFFICE PROGRESS NOTE   Diagnosis: Hereditary hemochromatosis  INTERVAL HISTORY:   Mr. Michael Lucero returns as scheduled.  He feels well.  He continues blood donation every 2-1/2 to 3 months.  No interim illnesses or infections.  He denies any bleeding.  No abdominal pain.  He has a good appetite.  He recently resumed Crestor.  Objective:  Vital signs in last 24 hours:  Blood pressure 137/71, pulse (!) 55, temperature 98.8 F (37.1 C), temperature source Oral, resp. rate 18, height 6' (1.829 m), weight 263 lb 11.2 oz (119.6 kg), SpO2 99 %.    HEENT: No thrush or ulcers. Resp: Lungs clear bilaterally. Cardio: Regular rate and rhythm. GI: Abdomen soft and nontender.  No hepatosplenomegaly. Vascular: No leg edema. Neuro: Alert and oriented. Skin: No rash.   Lab Results:  Lab Results  Component Value Date   WBC 6.8 02/24/2018   HGB 14.8 02/24/2018   HCT 40.5 02/24/2018   MCV 83.2 02/24/2018   PLT 221 02/24/2018   NEUTROABS 3.6 02/24/2018    Imaging:  No results found.  Medications: I have reviewed the patient's current medications.  Assessment/Plan: 1. 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 2. Liver lesion on ultrasound.  MRI liver 10/2014, normal. 3. Hypercholesterolemia, recently resumed Crestor per PCP.  Disposition: Mr. Michael Lucero appears stable.  CBC from today normal.  Iron studies are pending.  We will contact him once those results are available.  He will continue blood donation every 2-1/2 to 3 months.  We are checking labs on a 3974-month schedule.  AFP is being checked annually, next due December 2019.  Liver ultrasound every 2 to 3 years with next due December 2020.  He will return for a follow-up visit in 1 year.  He will contact the office in the interim with any problems.  Patient seen with Dr. Mosetta PuttFeng.    Lonna CobbLisa Lanai Conlee ANP/GNP-BC   02/24/2018  3:16  PM   Addendum  I have seen the patient, examined him. I agree with the assessment and and plan and have edited the notes.   Mr. Michael Lucero is doing well, continues to donate blood every 3 months.  His ferritin and transferrin saturation has been under control.  His recent cholesterol has been significantly elevated, I answered his questions about cholesterol and hemochromatosis.  I encouraged him to eat healthy, with more vegetable and fruits, less red meat, and try to lose some weight.  He voiced good understanding.  Continue monitoring, I will see him back in a year.  Malachy MoodYan Feng  02/24/2018

## 2018-02-28 ENCOUNTER — Telehealth: Payer: Self-pay

## 2018-02-28 NOTE — Telephone Encounter (Signed)
Per Dr. Mosetta PuttFeng left patient voice message that iron levels are normal, no concerns.

## 2018-02-28 NOTE — Telephone Encounter (Signed)
-----   Message from Malachy MoodYan Feng, MD sent at 02/25/2018  1:44 PM EDT ----- Elnita Maxwellheryl, please let pt know his iron levels, no concerns, thanks.   Malachy MoodYan Feng  02/25/2018

## 2018-03-27 ENCOUNTER — Other Ambulatory Visit: Payer: Self-pay | Admitting: Medical

## 2018-06-16 ENCOUNTER — Other Ambulatory Visit: Payer: BC Managed Care – PPO

## 2018-09-15 ENCOUNTER — Ambulatory Visit: Payer: BC Managed Care – PPO | Admitting: Medical

## 2018-09-15 ENCOUNTER — Encounter: Payer: Self-pay | Admitting: Medical

## 2018-09-15 VITALS — BP 110/78 | HR 60 | Temp 98.2°F | Ht 72.0 in | Wt 262.2 lb

## 2018-09-15 DIAGNOSIS — R5383 Other fatigue: Secondary | ICD-10-CM | POA: Diagnosis not present

## 2018-09-15 DIAGNOSIS — E785 Hyperlipidemia, unspecified: Secondary | ICD-10-CM | POA: Diagnosis not present

## 2018-09-15 NOTE — Progress Notes (Signed)
  Subjective:     Patient ID: Michael Lucero, male   DOB: [birth year], 33 y.o.   MRN: [id number]  HPI Chief Complaint  Patient presents with  . Medication Management    fasting for lipids    Here for med check.     Has concerns about fertility. He and wife trying to conceive, and not been successful in recent months.  She has PCOS.   He wonders if he is doing anything that may compromise his ability to conceive.    His compliance with Crestor is hit or miss since starting the medicine this past June 2019.  He is trying to eat healthy, he is active, is a Psychologist, occupational, exercising.  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  No other new complaint  Past Medical History:  Diagnosis Date  . Allergy   . Childhood asthma   . Hemochromatosis 2016  . Hyperlipidemia   . Vision problem    wears contacts   Current Outpatient Medications on File Prior to Visit  Medication Sig Dispense Refill  . EPINEPHrine (EPIPEN 2-PAK) 0.3 mg/0.3 mL IJ SOAJ injection Inject 0.3 mLs (0.3 mg total) into the muscle as needed. 2 Device 0  . rosuvastatin (CRESTOR) 10 MG tablet TAKE 1 TABLET BY MOUTH EVERYDAY AT BEDTIME 30 tablet 2   No current facility-administered medications on file prior to visit.     Review of Systems As in subjective    Objective:   Physical Exam BP 110/78   Pulse 60   Temp 98.2 F (36.8 C) (Oral)   Ht 6' (1.829 m)   Wt 262 lb 3.2 oz (118.9 kg)   SpO2 98%   BMI 35.56 kg/m   Wt Readings from Last 3 Encounters:  09/15/18 262 lb 3.2 oz (118.9 kg)  02/24/18 263 lb 11.2 oz (119.6 kg)  12/21/17 269 lb (122 kg)   General appearance: alert, no distress, WD/WN,  Neck: supple, no lymphadenopathy, no thyromegaly, no masses Heart: RRR, normal S1, S2, no murmurs Lungs: CTA bilaterally, no wheezes, rhonchi, or rales Abdomen: +bs, soft, non tender, non distended, no masses, no hepatomegaly, no  splenomegaly Pulses: 2+ symmetric, upper and lower extremities, normal cap refill GU: male, normal testes, no mass, no lymphadenopathy      Assessment:     Encounter Diagnoses  Name Primary?  . Dyslipidemia Yes  . Hereditary hemochromatosis (HCC)   . Other fatigue         Plan:     We discussed his concerns, intermittent compliance.  His main concern is fertility and conceiving.  We will update labs today  I advise he discuss with his wife's fertility doctor his health issues and medicines  Work on losing weight through health diet and exercise  Shayla was seen today for medication management.  Diagnoses and all orders for this visit:  Dyslipidemia -     Comprehensive metabolic panel -     Lipid panel  Hereditary hemochromatosis (HCC)  Other fatigue -     TSH -     Testosterone

## 2018-09-16 LAB — COMPREHENSIVE METABOLIC PANEL
ALK PHOS: 71 IU/L (ref 39–117)
ALT: 21 IU/L (ref 0–44)
AST: 20 IU/L (ref 0–40)
Albumin/Globulin Ratio: 2 (ref 1.2–2.2)
Albumin: 4.9 g/dL (ref 4.0–5.0)
BILIRUBIN TOTAL: 0.4 mg/dL (ref 0.0–1.2)
BUN/Creatinine Ratio: 15 (ref 9–20)
BUN: 19 mg/dL (ref 6–20)
CHLORIDE: 101 mmol/L (ref 96–106)
CO2: 22 mmol/L (ref 20–29)
Calcium: 9.7 mg/dL (ref 8.7–10.2)
Creatinine, Ser: 1.23 mg/dL (ref 0.76–1.27)
GFR calc Af Amer: 89 mL/min/{1.73_m2} (ref >59)
GFR calc non Af Amer: 77 mL/min/{1.73_m2} (ref >59)
GLUCOSE: 92 mg/dL (ref 65–99)
Globulin, Total: 2.4 g/dL (ref 1.5–4.5)
Potassium: 4.7 mmol/L (ref 3.5–5.2)
Sodium: 137 mmol/L (ref 134–144)
Total Protein: 7.3 g/dL (ref 6.0–8.5)

## 2018-09-16 LAB — LIPID PANEL
CHOLESTEROL TOTAL: 256 mg/dL — AB (ref 100–199)
Chol/HDL Ratio: 5.4 ratio — ABNORMAL HIGH (ref 0.0–5.0)
HDL: 47 mg/dL (ref >39)
LDL Calculated: 188 mg/dL — ABNORMAL HIGH (ref 0–99)
Triglycerides: 107 mg/dL (ref 0–149)
VLDL CHOLESTEROL CAL: 21 mg/dL (ref 5–40)

## 2018-09-16 LAB — TSH: TSH: 1.99 u[IU]/mL (ref 0.450–4.500)

## 2018-09-16 LAB — TESTOSTERONE: TESTOSTERONE: 308 ng/dL (ref 264–916)

## 2018-10-27 ENCOUNTER — Inpatient Hospital Stay: Payer: BC Managed Care – PPO | Attending: Medical

## 2019-01-20 ENCOUNTER — Other Ambulatory Visit: Payer: Self-pay | Admitting: Medical

## 2019-02-22 ENCOUNTER — Other Ambulatory Visit: Payer: Self-pay

## 2019-02-22 ENCOUNTER — Telehealth: Payer: Self-pay | Admitting: Hematology

## 2019-02-22 NOTE — Telephone Encounter (Signed)
Called pt per 8/13 schmessage - no answer - left message for patient to call back to reschedule appt.

## 2019-02-23 ENCOUNTER — Inpatient Hospital Stay: Payer: BC Managed Care – PPO | Attending: Hematology

## 2019-02-23 ENCOUNTER — Inpatient Hospital Stay: Payer: BC Managed Care – PPO | Admitting: Hematology

## 2019-12-19 ENCOUNTER — Telehealth: Payer: Self-pay

## 2019-12-19 ENCOUNTER — Other Ambulatory Visit: Payer: Self-pay | Admitting: Medical

## 2019-12-19 NOTE — Telephone Encounter (Signed)
Pt. Last apt was 09/15/18 and has no future apts.

## 2019-12-19 NOTE — Telephone Encounter (Signed)
Is this ok to refill pt. 

## 2019-12-19 NOTE — Telephone Encounter (Signed)
Schedule fasting physical, refill x #30

## 2019-12-19 NOTE — Telephone Encounter (Signed)
I called the pt. Had to LM to let him know to call back to schedule a fasting CPE and we could call his rosuvastatin in then.

## 2020-03-14 ENCOUNTER — Other Ambulatory Visit: Payer: Self-pay

## 2020-03-14 ENCOUNTER — Ambulatory Visit: Payer: BC Managed Care – PPO | Admitting: Medical

## 2020-03-14 ENCOUNTER — Encounter: Payer: Self-pay | Admitting: Medical

## 2020-03-14 VITALS — BP 124/82 | HR 69 | Ht 72.0 in | Wt 275.2 lb

## 2020-03-14 DIAGNOSIS — J4520 Mild intermittent asthma, uncomplicated: Secondary | ICD-10-CM | POA: Insufficient documentation

## 2020-03-14 DIAGNOSIS — Z91030 Bee allergy status: Secondary | ICD-10-CM | POA: Insufficient documentation

## 2020-03-14 DIAGNOSIS — E785 Hyperlipidemia, unspecified: Secondary | ICD-10-CM

## 2020-03-14 DIAGNOSIS — Z7185 Encounter for immunization safety counseling: Secondary | ICD-10-CM | POA: Insufficient documentation

## 2020-03-14 DIAGNOSIS — Z8349 Family history of other endocrine, nutritional and metabolic diseases: Secondary | ICD-10-CM | POA: Diagnosis not present

## 2020-03-14 DIAGNOSIS — Z0000 Encounter for general adult medical examination without abnormal findings: Secondary | ICD-10-CM

## 2020-03-14 DIAGNOSIS — Z7189 Other specified counseling: Secondary | ICD-10-CM

## 2020-03-14 DIAGNOSIS — Z131 Encounter for screening for diabetes mellitus: Secondary | ICD-10-CM

## 2020-03-14 DIAGNOSIS — Z8249 Family history of ischemic heart disease and other diseases of the circulatory system: Secondary | ICD-10-CM | POA: Insufficient documentation

## 2020-03-14 MED ORDER — ROSUVASTATIN CALCIUM 10 MG PO TABS: ORAL_TABLET | ORAL | 3 refills | Status: DC

## 2020-03-14 MED ORDER — EPINEPHRINE 0.3 MG/0.3ML IJ SOAJ
0.3000 mg | INTRAMUSCULAR | 1 refills | Status: DC | PRN
Start: 2020-03-14 — End: 2022-05-13

## 2020-03-14 NOTE — Progress Notes (Signed)
Subjective:   HPI  Michael Lucero is a 34 y.o. male who presents for Chief Complaint  Patient presents with  . Annual Exam    cpe with fasting labs     Patient Care Team: Michael Lucero, Michael Lucero as PCP - General (Family Medicine) Michael Hawking, MD as Consulting Physician (Gastroenterology) Sees dentist Sees eye doctor  Concerns: Doing well  Has a first child due January 2022  Ran out of Crestor 1/2 months ago.  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  It is still quite stressful and time-consuming.  He is still working full-time  Hemochromatosis-he still donates blood every few months in lieu of phlebotomy at the hematology center.  He was due for a liver ultrasound last year but was unable do this with Covid shutting everything down  Reviewed their medical, surgical, family, social, medication, and allergy history and updated chart as appropriate.  Past Medical History:  Diagnosis Date  . Allergy   . Childhood asthma   . Hemochromatosis 2016  . Hyperlipidemia   . Vision problem    wears contacts    Past Surgical History:  Procedure Laterality Date  . SHOULDER SURGERY     x 3 surgeries, 2 for labral tear; right shoulder  . WISDOM TOOTH EXTRACTION  01/2013    Family History  Problem Relation Age of Onset  . Diabetes Father   . Heart disease Father        CABG age 28yo  . Cancer Father         neuroendocrine tumor of stomach  . Other Father        died post surgery with organ failure after tumor removal  . Hemochromatosis Mother        CYS282Y gene  . Hyperlipidemia Mother   . Hemochromatosis Maternal Uncle   . Hemochromatosis Maternal Grandmother   . Stroke Neg Hx   . Hypertension Neg Hx      Current Outpatient Medications:  .  EPINEPHrine (EPIPEN 2-PAK) 0.3 mg/0.3 mL IJ SOAJ injection, Inject 0.3 mLs (0.3 mg total) into the muscle as needed., Disp: 1 each, Rfl:  1 .  rosuvastatin (CRESTOR) 10 MG tablet, TAKE 1 TABLET BY MOUTH EVERYDAY AT BEDTIME, Disp: 90 tablet, Rfl: 3  Allergies  Allergen Reactions  . Bee Venom Anaphylaxis       Review of Systems Constitutional: -fever, -chills, -sweats, -unexpected weight change, -decreased appetite, -fatigue Allergy: -sneezing, -itching, -congestion Dermatology: -changing moles, --rash, -lumps ENT: -runny nose, -ear pain, -sore throat, -hoarseness, -sinus pain, -teeth pain, - ringing in ears, -hearing loss, -nosebleeds Cardiology: -chest pain, -palpitations, -swelling, -difficulty breathing when lying flat, -waking up short of breath Respiratory: -cough, -shortness of breath, -difficulty breathing with exercise or exertion, -wheezing, -coughing up blood Gastroenterology: -abdominal pain, -nausea, -vomiting, -diarrhea, -constipation, -blood in stool, -changes in bowel movement, -difficulty swallowing or eating Hematology: -bleeding, -bruising  Musculoskeletal: -joint aches, -muscle aches, -joint swelling, -back pain, -neck pain, -cramping, -changes in gait Ophthalmology: denies vision changes, eye redness, itching, discharge Urology: -burning with urination, -difficulty urinating, -blood in urine, -urinary frequency, -urgency, -incontinence Neurology: -headache, -weakness, -tingling, -numbness, -memory loss, -falls, -dizziness Psychology: -depressed mood, -agitation, -sleep problems Male GU: no testicular mass, pain, no lymph nodes swollen, no swelling, no rash.     Objective:  BP 124/82   Pulse 69   Ht 6' (1.829  m)   Wt 275 lb 3.2 oz (124.8 kg)   SpO2 96%   BMI 37.32 kg/m   General appearance: alert, no distress, WD/WN, Caucasian male Skin: unremarkable Neck: supple, no lymphadenopathy, no thyromegaly, no masses, normal ROM, no bruits Chest: non tender, normal shape and expansion Heart: RRR, normal S1, S2, no murmurs Lungs: CTA bilaterally, no wheezes, rhonchi, or rales Abdomen: +bs, soft, non  tender, non distended, no masses, no hepatomegaly, no splenomegaly, no bruits Back: non tender, normal ROM, no scoliosis Musculoskeletal: upper extremities non tender, no obvious deformity, normal ROM throughout, lower extremities non tender, no obvious deformity, normal ROM throughout Extremities: no edema, no cyanosis, no clubbing Pulses: 2+ symmetric, upper and lower extremities, normal cap refill Neurological: alert, oriented x 3, CN2-12 intact, strength normal upper extremities and lower extremities, sensation normal throughout, DTRs 2+ throughout, no cerebellar signs, gait normal Psychiatric: normal affect, behavior normal, pleasant  GU: normal male external genitalia,circumcised, nontender, no masses, no hernia, no lymphadenopathy Rectal: deferred   Assessment and Plan :   Encounter Diagnoses  Name Primary?  . Encounter for health maintenance examination in adult Yes  . Dyslipidemia   . Family history of hemochromatosis   . Hereditary hemochromatosis (HCC)   . Family history of premature CAD   . Mild intermittent asthma, unspecified whether complicated   . Bee allergy status   . Screening for diabetes mellitus   . Vaccine counseling     Physical exam - discussed and counseled on healthy lifestyle, diet, exercise, preventative care, vaccinations, sick and well care, proper use of emergency dept and after hours care, and addressed their concerns.    Health screening: See your eye doctor yearly for routine vision care. See your dentist yearly for routine dental care including hygiene visits twice yearly.  Cancer screening Advised monthly self testicular exam   Vaccinations: Advised yearly influenza vaccine He will get flu shot free at work  He has had his Covid vaccine  Tetanus up-to-date    Separate significant issues discussed: Dyslipidemia, family history of premature CAD-continue statin  BMI greater than 30 -counseled on need to lose weight through healthy  diet and exercise.  He is doing well with exercise.  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  Diagnoses and all orders for this visit:  Encounter for health maintenance examination in adult -     Comprehensive metabolic panel -     CBC -     Ferritin -     Iron and TIBC -     Lipid panel -     Hemoglobin A1c  Dyslipidemia  Family history of hemochromatosis -     CBC -     Ferritin -     Iron and TIBC  Hereditary hemochromatosis (HCC) -     CBC -     Ferritin -     Iron and TIBC  Family history of premature CAD  Mild intermittent asthma, unspecified whether complicated  Bee allergy status  Screening for diabetes mellitus -     Hemoglobin A1c  Vaccine counseling  Other orders -     EPINEPHrine (EPIPEN 2-PAK) 0.3 mg/0.3 mL IJ SOAJ injection; Inject 0.3 mLs (0.3 mg total) into the muscle as needed. -     rosuvastatin (CRESTOR) 10 MG tablet; TAKE 1 TABLET BY  MOUTH EVERYDAY AT BEDTIME    Follow-up pending labs, yearly for physical

## 2020-03-15 LAB — LIPID PANEL
Chol/HDL Ratio: 5.3 ratio — ABNORMAL HIGH (ref 0.0–5.0)
Cholesterol, Total: 219 mg/dL — ABNORMAL HIGH (ref 100–199)
HDL: 41 mg/dL (ref >39)
LDL Chol Calc (NIH): 149 mg/dL — ABNORMAL HIGH (ref 0–99)
Triglycerides: 158 mg/dL — ABNORMAL HIGH (ref 0–149)
VLDL Cholesterol Cal: 29 mg/dL (ref 5–40)

## 2020-03-15 LAB — IRON AND TIBC
Iron Saturation: 60 % — ABNORMAL HIGH (ref 15–55)
Iron: 180 ug/dL — ABNORMAL HIGH (ref 38–169)
Total Iron Binding Capacity: 299 ug/dL (ref 250–450)
UIBC: 119 ug/dL (ref 111–343)

## 2020-03-15 LAB — COMPREHENSIVE METABOLIC PANEL
ALT: 44 IU/L (ref 0–44)
AST: 25 IU/L (ref 0–40)
Albumin/Globulin Ratio: 2.3 — ABNORMAL HIGH (ref 1.2–2.2)
Albumin: 4.8 g/dL (ref 4.0–5.0)
Alkaline Phosphatase: 71 IU/L (ref 48–121)
BUN/Creatinine Ratio: 15 (ref 9–20)
BUN: 15 mg/dL (ref 6–20)
Bilirubin Total: 0.7 mg/dL (ref 0.0–1.2)
CO2: 21 mmol/L (ref 20–29)
Calcium: 9.5 mg/dL (ref 8.7–10.2)
Chloride: 104 mmol/L (ref 96–106)
Creatinine, Ser: 1.01 mg/dL (ref 0.76–1.27)
GFR calc Af Amer: 111 mL/min/{1.73_m2} (ref >59)
GFR calc non Af Amer: 96 mL/min/{1.73_m2} (ref >59)
Globulin, Total: 2.1 g/dL (ref 1.5–4.5)
Glucose: 98 mg/dL (ref 65–99)
Potassium: 4.5 mmol/L (ref 3.5–5.2)
Sodium: 140 mmol/L (ref 134–144)
Total Protein: 6.9 g/dL (ref 6.0–8.5)

## 2020-03-15 LAB — CBC
Hematocrit: 45 % (ref 37.5–51.0)
Hemoglobin: 15.8 g/dL (ref 13.0–17.7)
MCH: 30.3 pg (ref 26.6–33.0)
MCHC: 35.1 g/dL (ref 31.5–35.7)
MCV: 86 fL (ref 79–97)
Platelets: 218 10*3/uL (ref 150–450)
RBC: 5.21 x10E6/uL (ref 4.14–5.80)
RDW: 12.5 % (ref 11.6–15.4)
WBC: 5.9 10*3/uL (ref 3.4–10.8)

## 2020-03-15 LAB — HEMOGLOBIN A1C
Est. average glucose Bld gHb Est-mCnc: 111 mg/dL
Hgb A1c MFr Bld: 5.5 % (ref 4.8–5.6)

## 2020-03-15 LAB — FERRITIN: Ferritin: 80 ng/mL (ref 30–400)

## 2020-03-18 ENCOUNTER — Other Ambulatory Visit: Payer: Self-pay | Admitting: Medical

## 2021-01-14 ENCOUNTER — Telehealth: Payer: BC Managed Care – PPO | Admitting: Medical

## 2021-01-14 ENCOUNTER — Other Ambulatory Visit: Payer: Self-pay

## 2021-01-14 ENCOUNTER — Encounter: Payer: Self-pay | Admitting: Hematology

## 2021-01-14 VITALS — Wt 275.0 lb

## 2021-01-14 DIAGNOSIS — R059 Cough, unspecified: Secondary | ICD-10-CM | POA: Diagnosis not present

## 2021-01-14 DIAGNOSIS — J988 Other specified respiratory disorders: Secondary | ICD-10-CM

## 2021-01-14 DIAGNOSIS — R0981 Nasal congestion: Secondary | ICD-10-CM

## 2021-01-14 MED ORDER — AZITHROMYCIN 250 MG PO TABS: ORAL_TABLET | ORAL | 0 refills | Status: DC

## 2021-01-14 MED ORDER — HYDROCODONE BIT-HOMATROP MBR 5-1.5 MG/5ML PO SOLN: 5.0000 mL | Freq: Three times a day (TID) | ORAL | 0 refills | Status: AC | PRN

## 2021-01-14 NOTE — Progress Notes (Signed)
Subjective:     Patient ID: Michael Lucero, male   DOB: 06-11-1986, 35 y.o.   MRN: [id number]  This visit type was conducted due to national recommendations for restrictions regarding the COVID-19 Pandemic (e.g. social distancing) in an effort to limit this patient's exposure and mitigate transmission in our community.  Due to their co-morbid illnesses, this patient is at least at moderate risk for complications without adequate follow up.  This format is felt to be most appropriate for this patient at this time.    Documentation for virtual audio and video telecommunications through [facility] encounter:  The patient was located at home. The provider was located in the office. The patient did consent to this visit and is aware of possible charges through their insurance for this visit.  The other persons participating in this telemedicine service were none. Time spent on call was 20 minutes and in review of previous records 20 minutes total.  This virtual service is not related to other E/M service within previous 7 days.   HPI Chief Complaint  Patient presents with   congestion and cough    On vacation in [place] last week and congestion started Wednesday or Thursday last week. Cough started Monday. Home covid test Monday and it was negative   Virtual consult for illness.  About 1 week ago awoke congested, swollen nodes.  Thought it was from the air given window units in a rental house on vacation in [place] the beach.  Over the next few days having mucous, congestion, coughing.  Coughing bad the last 2 days.  Using some OTC cough remedy, mucinex and Claritin.  After a few hours of sleep will awake with cough fits.  Today is better some but still feels head congestion.  Had negative covid test at home few days ago.    No fever, no NVD.   No significant SOB but has had some wheezing.  No bad headache but some sore throat.  No loss of smell, but lots some taste after using mucinex.   No  sick contacts.  No body aches or chills.      Son is 32mo and he got covid 3-4 weeks ago.    Review of Systems As in subjective    Objective:   Physical Exam Due to coronavirus pandemic stay at home measures, patient visit was virtual and they were not examined in person.   Wt 275 lb (124.7 kg)   BMI 37.30 kg/m   Gen: Well-developed well-nourished no acute distress, somewhat congested sounding No labored breathing, no wheeze disease, not severely ill-appearing      Assessment:     Encounter Diagnoses  Name Primary?   Cough Yes   Respiratory tract infection    Sinus congestion        Plan:     Discussed limitations of virtual consult.  We discussed symptoms possible differential.  He is on day 7 of symptoms and not seen a huge improvement but not much worse either.  We will tentatively treat for viral respiratory tract infection.  Advise rest, hydration, continue Mucinex over-the-counter, can begin Hycodan at night for sleep and for worse cough  If worse symptoms over the next 48 hours can begin Z-Pak otherwise we will use a watch and wait approach on this.  If symptoms improved gradually the next few days as suspected, then continue supportive measures without the antibiotic  [place] was seen today for congestion and cough.  Diagnoses and all orders for  this visit:  Cough  Respiratory tract infection  Sinus congestion  Other orders -     HYDROcodone bit-homatropine (HYCODAN) 5-1.5 MG/5ML syrup; Take 5 mLs by mouth every 8 (eight) hours as needed for up to 5 days for cough. -     azithromycin (ZITHROMAX) 250 MG tablet; 2 tablets day 1, then 1 tablet days 2-4  F/u prn

## 2021-05-16 ENCOUNTER — Other Ambulatory Visit: Payer: Self-pay | Admitting: Medical

## 2021-12-25 ENCOUNTER — Ambulatory Visit: Payer: BC Managed Care – PPO | Admitting: Medical

## 2022-03-09 ENCOUNTER — Other Ambulatory Visit: Payer: Self-pay | Admitting: Medical

## 2022-03-10 ENCOUNTER — Other Ambulatory Visit: Payer: Self-pay

## 2022-03-10 ENCOUNTER — Telehealth: Payer: Self-pay | Admitting: Medical

## 2022-03-10 MED ORDER — ROSUVASTATIN CALCIUM 10 MG PO TABS: ORAL_TABLET | ORAL | 0 refills | Status: DC

## 2022-03-10 NOTE — Telephone Encounter (Signed)
Pt made a cpe nov the 2nd that was the first opening  He needs a refill on his crestor please send to the  [id number] - [place], [place] - [address]

## 2022-03-17 ENCOUNTER — Encounter: Payer: Self-pay | Admitting: Internal Medicine

## 2022-04-19 ENCOUNTER — Encounter: Payer: Self-pay | Admitting: Hematology

## 2022-04-19 ENCOUNTER — Telehealth: Payer: BC Managed Care – PPO | Admitting: Emergency Medicine

## 2022-04-19 DIAGNOSIS — R112 Nausea with vomiting, unspecified: Secondary | ICD-10-CM | POA: Diagnosis not present

## 2022-04-19 DIAGNOSIS — R051 Acute cough: Secondary | ICD-10-CM | POA: Diagnosis not present

## 2022-04-19 DIAGNOSIS — R197 Diarrhea, unspecified: Secondary | ICD-10-CM | POA: Diagnosis not present

## 2022-04-19 MED ORDER — ONDANSETRON 4 MG PO TBDP: 4.0000 mg | ORAL_TABLET | Freq: Three times a day (TID) | ORAL | 0 refills | Status: DC | PRN

## 2022-04-19 MED ORDER — AZITHROMYCIN 250 MG PO TABS: ORAL_TABLET | ORAL | 0 refills | Status: DC

## 2022-04-19 MED ORDER — BENZONATATE 100 MG PO CAPS: 100.0000 mg | ORAL_CAPSULE | Freq: Two times a day (BID) | ORAL | 0 refills | Status: DC | PRN

## 2022-04-19 NOTE — Progress Notes (Signed)
Virtual Visit Consent   Michael Lucero, you are scheduled for a virtual visit with a [facility] provider today. Just as with appointments in the office, your consent must be obtained to participate. Your consent will be active for this visit and any virtual visit you may have with one of our providers in the next 365 days. If you have a MyChart account, a copy of this consent can be sent to you electronically.  As this is a virtual visit, video technology does not allow for your provider to perform a traditional examination. This may limit your provider's ability to fully assess your condition. If your provider identifies any concerns that need to be evaluated in person or the need to arrange testing (such as labs, EKG, etc.), we will make arrangements to do so. Although advances in technology are sophisticated, we cannot ensure that it will always work on either your end or our end. If the connection with a video visit is poor, the visit may have to be switched to a telephone visit. With either a video or telephone visit, we are not always able to ensure that we have a secure connection.  By engaging in this virtual visit, you consent to the provision of healthcare and authorize for your insurance to be billed (if applicable) for the services provided during this visit. Depending on your insurance coverage, you may receive a charge related to this service.  I need to obtain your verbal consent now. Are you willing to proceed with your visit today? Michael Lucero has provided verbal consent on 04/19/2022 for a virtual visit (video or telephone). Montine Circle, PA-C  Date: 04/19/2022 11:17 AM  Virtual Visit via Video Note   I, Montine Circle, connected with  Michael Lucero  ([id number], 02/01/1986) on 04/19/22 at 11:15 AM EDT by a video-enabled telemedicine application and verified that I am speaking with the correct person using two identifiers.  Location: Patient: Virtual Visit Location Patient:  Other: work Provider: Scientist, research (medical) Provider: Home   I discussed the limitations of evaluation and management by telemedicine and the availability of in person appointments. The patient expressed understanding and agreed to proceed.    History of Present Illness: Michael Lucero is a 36 y.o. who identifies as a male who was assigned male at birth, and is being seen today for cough, congestion.  Gradually worsening over the past 2 weeks.  States that he has had some post-tussive emesis.  Reports having had some diarrhea and vomiting over the weekend.  Denies fever.  States that the congestion is improving.  Has tried taking mucinex, OTC cough and cold.    HPI: HPI  Problems:  Patient Active Problem List   Diagnosis Date Noted   Family history of premature CAD 03/14/2020   Mild intermittent asthma 03/14/2020   Bee allergy status 03/14/2020   Screening for diabetes mellitus 03/14/2020   Vaccine counseling 03/14/2020   Encounter for health maintenance examination in adult 02/04/2016   Screening for tuberculosis 02/04/2016   Hemochromatosis 08/21/2014   Dyslipidemia 06/12/2014   Family history of hemochromatosis 06/12/2014    Allergies:  Allergies  Allergen Reactions   Bee Venom Anaphylaxis   Medications:  Current Outpatient Medications:    azithromycin (ZITHROMAX) 250 MG tablet, 2 tablets day 1, then 1 tablet days 2-4, Disp: 6 tablet, Rfl: 0   EPINEPHrine (EPIPEN 2-PAK) 0.3 mg/0.3 mL IJ SOAJ injection, Inject 0.3 mLs (0.3 mg total) into the muscle as needed., Disp: 1 each, Rfl: 1  rosuvastatin (CRESTOR) 10 MG tablet, TAKE 1 TABLET BY MOUTH EVERYDAY AT BEDTIME, Disp: 90 tablet, Rfl: 0  Observations/Objective: Patient is well-developed, well-nourished in no acute distress.  Resting comfortably at home.  Head is normocephalic, atraumatic.  No labored breathing.  Speech is clear and coherent with logical content.  Patient is alert and oriented at baseline.    Assessment and  Plan: 1. Acute cough  2. Nausea vomiting and diarrhea  - Zofran for nausea/vomiting/diarrhea.  Sounds like symptoms are starting to improve slightly.  Doesn't appear uncomfortable or toxic.  Feel that outpatient trial is appropriate. - Tesslon for cough - Z-pak for cough.  Discussed that this might exacerbate GI upset, but given that the cough has been persistent for almost 2 weeks, this may be beneficial.  Follow Up Instructions: I discussed the assessment and treatment plan with the patient. The patient was provided an opportunity to ask questions and all were answered. The patient agreed with the plan and demonstrated an understanding of the instructions.  A copy of instructions were sent to the patient via MyChart unless otherwise noted below.     The patient was advised to call back or seek an in-person evaluation if the symptoms worsen or if the condition fails to improve as anticipated.  Time:  I spent 12 minutes with the patient via telehealth technology discussing the above problems/concerns.    Montine Circle, PA-C

## 2022-04-20 ENCOUNTER — Encounter: Payer: Self-pay | Admitting: Internal Medicine

## 2022-05-13 ENCOUNTER — Ambulatory Visit: Payer: BC Managed Care – PPO | Admitting: Medical

## 2022-05-13 ENCOUNTER — Encounter: Payer: Self-pay | Admitting: Medical

## 2022-05-13 VITALS — BP 122/82 | HR 71 | Ht 72.0 in | Wt 274.0 lb

## 2022-05-13 DIAGNOSIS — Z136 Encounter for screening for cardiovascular disorders: Secondary | ICD-10-CM

## 2022-05-13 DIAGNOSIS — Z8349 Family history of other endocrine, nutritional and metabolic diseases: Secondary | ICD-10-CM

## 2022-05-13 DIAGNOSIS — E785 Hyperlipidemia, unspecified: Secondary | ICD-10-CM

## 2022-05-13 DIAGNOSIS — Z8249 Family history of ischemic heart disease and other diseases of the circulatory system: Secondary | ICD-10-CM

## 2022-05-13 DIAGNOSIS — Z131 Encounter for screening for diabetes mellitus: Secondary | ICD-10-CM

## 2022-05-13 DIAGNOSIS — Z7185 Encounter for immunization safety counseling: Secondary | ICD-10-CM

## 2022-05-13 DIAGNOSIS — R059 Cough, unspecified: Secondary | ICD-10-CM

## 2022-05-13 DIAGNOSIS — J4520 Mild intermittent asthma, uncomplicated: Secondary | ICD-10-CM

## 2022-05-13 DIAGNOSIS — Z91030 Bee allergy status: Secondary | ICD-10-CM

## 2022-05-13 DIAGNOSIS — Z0000 Encounter for general adult medical examination without abnormal findings: Secondary | ICD-10-CM | POA: Diagnosis not present

## 2022-05-13 DIAGNOSIS — Z23 Encounter for immunization: Secondary | ICD-10-CM | POA: Diagnosis not present

## 2022-05-13 MED ORDER — BENZONATATE 200 MG PO CAPS: 200.0000 mg | ORAL_CAPSULE | Freq: Three times a day (TID) | ORAL | 0 refills | Status: DC | PRN

## 2022-05-13 MED ORDER — EPINEPHRINE 0.3 MG/0.3ML IJ SOAJ: 0.3000 mg | INTRAMUSCULAR | 2 refills | Status: DC | PRN

## 2022-05-13 MED ORDER — ALBUTEROL SULFATE HFA 108 (90 BASE) MCG/ACT IN AERS: 2.0000 | INHALATION_SPRAY | Freq: Four times a day (QID) | RESPIRATORY_TRACT | 0 refills | Status: DC | PRN

## 2022-05-13 MED ORDER — PREDNISONE 20 MG PO TABS: 20.0000 mg | ORAL_TABLET | Freq: Every day | ORAL | 0 refills | Status: DC

## 2022-05-13 NOTE — Progress Notes (Signed)
Subjective:   HPI  Michael Lucero is a 36 y.o. male who presents for Chief Complaint  Patient presents with   fasting cpe    Fasting cpe, would like flu shot, no concerns. Cough x 5 weeks    Patient Care Team: Gianna Calef, Camelia Eng, PA-C as PCP - General (Family Medicine) Carol Ada, MD as Consulting Physician (Gastroenterology) Sees dentist Sees eye doctor  Concerns: Here for routine physical  Hemochromatosis-he continues to do blood donations every 10 weeks.  He is feeling fine.  He had a bad respiratory tract infection about 5 weeks ago.  The initial illness went away but he has had ongoing cough for several weeks now.  No runny nose or sneezing, no congestion, no fever, no body aches or chills.  Just residual aggravating cough  He also had a stomach bug 2 days ago but it resolved.  He would like a flu shot today   Reviewed their medical, surgical, family, social, medication, and allergy history and updated chart as appropriate.  Past Medical History:  Diagnosis Date   Allergy    Childhood asthma    Hemochromatosis 2016   Hyperlipidemia    Vision problem    wears contacts    Past Surgical History:  Procedure Laterality Date   SHOULDER SURGERY     x 3 surgeries, 2 for labral tear; right shoulder   WISDOM TOOTH EXTRACTION  01/2013    Family History  Problem Relation Age of Onset   Diabetes Father    Heart disease Father        CABG age 22yo   Cancer Father         neuroendocrine tumor of stomach   Other Father        died post surgery with organ failure after tumor removal   Hemochromatosis Mother        CYS282Y gene   Hyperlipidemia Mother    Hemochromatosis Maternal Uncle    Hemochromatosis Maternal Grandmother    Stroke Neg Hx    Hypertension Neg Hx      Current Outpatient Medications:    albuterol (VENTOLIN HFA) 108 (90 Base) MCG/ACT inhaler, Inhale 2 puffs into the lungs every 6 (six) hours as needed for wheezing or shortness of breath., Disp: 8  g, Rfl: 0   benzonatate (TESSALON) 200 MG capsule, Take 1 capsule (200 mg total) by mouth 3 (three) times daily as needed for cough., Disp: 30 capsule, Rfl: 0   predniSONE (DELTASONE) 20 MG tablet, Take 1 tablet (20 mg total) by mouth daily with breakfast., Disp: 7 tablet, Rfl: 0   rosuvastatin (CRESTOR) 10 MG tablet, TAKE 1 TABLET BY MOUTH EVERYDAY AT BEDTIME, Disp: 90 tablet, Rfl: 0   EPINEPHrine (EPIPEN 2-PAK) 0.3 mg/0.3 mL IJ SOAJ injection, Inject 0.3 mg into the muscle as needed., Disp: 1 each, Rfl: 2  Allergies  Allergen Reactions   Bee Venom Anaphylaxis     Review of Systems Constitutional: -fever, -chills, -sweats, -unexpected weight change, -decreased appetite, -fatigue Allergy: -sneezing, -itching, -congestion Dermatology: -changing moles, --rash, -lumps ENT: -runny nose, -ear pain, -sore throat, -hoarseness, -sinus pain, -teeth pain, - ringing in ears, -hearing loss, -nosebleeds Cardiology: -chest pain, -palpitations, -swelling, -difficulty breathing when lying flat, -waking up short of breath Respiratory: +cough, -shortness of breath, -difficulty breathing with exercise or exertion, -wheezing, -coughing up blood Gastroenterology: -abdominal pain, -nausea, -vomiting, -diarrhea, -constipation, -blood in stool, -changes in bowel movement, -difficulty swallowing or eating Hematology: -bleeding, -bruising  Musculoskeletal: -joint aches, -muscle aches, -  joint swelling, -back pain, -neck pain, -cramping, -changes in gait Ophthalmology: denies vision changes, eye redness, itching, discharge Urology: -burning with urination, -difficulty urinating, -blood in urine, -urinary frequency, -urgency, -incontinence Neurology: -headache, -weakness, -tingling, -numbness, -memory loss, -falls, -dizziness Psychology: -depressed mood, -agitation, -sleep problems Male GU: no testicular mass, pain, no lymph nodes swollen, no swelling, no rash.     05/13/2022   11:47 AM 01/14/2021   12:53 PM 03/14/2020     8:40 AM 12/21/2017    2:31 PM  Depression screen PHQ 2/9  Decreased Interest 0 0 0 0  Down, Depressed, Hopeless 0 0 0 0  PHQ - 2 Score 0 0 0 0        Objective:  BP 122/82   Pulse 71   Ht 6' (1.829 m)   Wt 274 lb (124.3 kg)   BMI 37.16 kg/m   General appearance: alert, no distress, WD/WN, Caucasian male Skin: unremarkable HEENT: normocephalic, conjunctiva/corneas normal, sclerae anicteric, PERRLA, EOMi, nares patent, no discharge or erythema, pharynx normal Oral cavity: MMM, tongue normal, teeth normal Neck: supple, no lymphadenopathy, no thyromegaly, no masses, normal ROM, no bruits Chest: non tender, normal shape and expansion Heart: RRR, normal S1, S2, no murmurs Lungs: CTA bilaterally, no wheezes, rhonchi, or rales Abdomen: +bs, soft, non tender, non distended, no masses, no hepatomegaly, no splenomegaly, no bruits Back: non tender, normal ROM, no scoliosis Musculoskeletal: upper extremities non tender, no obvious deformity, normal ROM throughout, lower extremities non tender, no obvious deformity, normal ROM throughout Extremities: no edema, no cyanosis, no clubbing Pulses: 2+ symmetric, upper and lower extremities, normal cap refill Neurological: alert, oriented x 3, CN2-12 intact, strength normal upper extremities and lower extremities, sensation normal throughout, DTRs 2+ throughout, no cerebellar signs, gait normal Psychiatric: normal affect, behavior normal, pleasant  GU: normal male external genitalia,circumcised, nontender, no masses, no hernia, no lymphadenopathy Rectal: deferred   Assessment and Plan :   Encounter Diagnoses  Name Primary?   Encounter for health maintenance examination in adult Yes   Needs flu shot    Hereditary hemochromatosis ([place])    Family history of hemochromatosis    Family history of premature CAD    Screening for diabetes mellitus    Vaccine counseling    Dyslipidemia    Bee allergy status    Mild intermittent asthma,  unspecified whether complicated    Cough, unspecified type    Screening for heart disease     This visit was a preventative care visit, also known as wellness visit or routine physical.   Topics typically include healthy lifestyle, diet, exercise, preventative care, vaccinations, sick and well care, proper use of emergency dept and after hours care, as well as other concerns.     Recommendations: Continue to return yearly for your annual wellness and preventative care visits.  This gives [place] a chance to discuss healthy lifestyle, exercise, vaccinations, review your chart record, and perform screenings where appropriate.  I recommend you see your eye doctor yearly for routine vision care.  I recommend you see your dentist yearly for routine dental care including hygiene visits twice yearly.   Vaccination recommendations were reviewed Immunization History  Administered Date(s) Administered   DTaP 02/15/1985, 04/14/1985, 06/16/1985, 04/20/1988, 11/22/1989   HIB (PRP-OMP) 09/21/1986   Hepatitis B 02/06/1999, 04/20/1999, 02/19/2000, 12/10/2011, 01/14/2012, 05/23/2012   Influenza,inj,Quad PF,6+ Mos 05/13/2022   MMR 12/15/1985, 11/12/1989   OPV 01/18/1985, 04/14/1985, 06/16/1985, 04/20/1988, 11/12/1989   PPD Test 12/08/2011, 02/04/2016   Td 02/06/1999  Tdap 01/19/2013    Counseled on the influenza virus vaccine.  Vaccine information sheet given.  Influenza vaccine given after consent obtained.   Screening for cancer: Colon cancer screening: Age 104  Testicular cancer screening You should do a monthly self testicular exam if you are between 14-67 years old  We discussed PSA, prostate exam, and prostate cancer screening risks/benefits.     Skin cancer screening: Check your skin regularly for new changes, growing lesions, or other lesions of concern Come in for evaluation if you have skin lesions of concern.  Lung cancer screening: If you have a greater than 20 pack year history  of tobacco use, then you may qualify for lung cancer screening with a chest CT scan.   Please call your insurance company to inquire about coverage for this test.  We currently don't have screenings for other cancers besides breast, cervical, colon, and lung cancers.  If you have a strong family history of cancer or have other cancer screening concerns, please let me know.    Bone health: Get at least 150 minutes of aerobic exercise weekly Get weight bearing exercise at least once weekly Bone density test:  A bone density test is an imaging test that uses a type of X-ray to measure the amount of calcium and other minerals in your bones. The test may be used to diagnose or screen you for a condition that causes weak or thin bones (osteoporosis), predict your risk for a broken bone (fracture), or determine how well your osteoporosis treatment is working. The bone density test is recommended for females 83 and older, or females or males <69 if certain risk factors such as thyroid disease, long term use of steroids such as for asthma or rheumatological issues, vitamin D deficiency, estrogen deficiency, family history of osteoporosis, self or family history of fragility fracture in first degree relative.    Heart health: Get at least 150 minutes of aerobic exercise weekly Limit alcohol It is important to maintain a healthy blood pressure and healthy cholesterol numbers  Heart disease screening: Screening for heart disease includes screening for blood pressure, fasting lipids, glucose/diabetes screening, BMI height to weight ratio, reviewed of smoking status, physical activity, and diet.    Goals include blood pressure 120/80 or less, maintaining a healthy lipid/cholesterol profile, preventing diabetes or keeping diabetes numbers under good control, not smoking or using tobacco products, exercising most days per week or at least 150 minutes per week of exercise, and eating healthy variety of fruits  and vegetables, healthy oils, and avoiding unhealthy food choices like fried food, fast food, high sugar and high cholesterol foods.    Other tests may possibly include EKG test, CT coronary calcium score, echocardiogram, exercise treadmill stress test.     Medical care options: I recommend you continue to seek care here first for routine care.  We try really hard to have available appointments Monday through Friday daytime hours for sick visits, acute visits, and physicals.  Urgent care should be used for after hours and weekends for significant issues that cannot wait till the next day.  The emergency department should be used for significant potentially life-threatening emergencies.  The emergency department is expensive, can often have long wait times for less significant concerns, so try to utilize primary care, urgent care, or telemedicine when possible to avoid unnecessary trips to the emergency department.  Virtual visits and telemedicine have been introduced since the pandemic started in 2020, and can be convenient ways to receive  medical care.  We offer virtual appointments as well to assist you in a variety of options to seek medical care.   Separate significant issues discussed: Hemachromatosis - updated labs today.  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   Medications today, Tessalon Perles, short dose of prednisone, albuterol if needed.  If not resolving in 2 weeks, then we would need to do chest xray   Cassey was seen today for fasting cpe.  Diagnoses and all orders for this visit:  Encounter for health maintenance examination in adult -     Comprehensive metabolic panel -     CBC -     Lipid panel -     Iron, TIBC and Ferritin Panel -     Hemoglobin A1c  Needs flu shot -     Flu Vaccine QUAD 58moIM (Fluarix, Fluzone & Alfiuria Quad  PF)  Hereditary hemochromatosis ([facility]) -     Iron, TIBC and Ferritin Panel  Family history of hemochromatosis  Family history of premature CAD -     CT CARDIAC SCORING ([facility] LOCATIONS ONLY); Future  Screening for diabetes mellitus -     Hemoglobin A1c  Vaccine counseling  Dyslipidemia -     Lipid panel -     CT CARDIAC SCORING ([facility] LOCATIONS ONLY); Future  Bee allergy status  Mild intermittent asthma, unspecified whether complicated  Cough, unspecified type  Screening for heart disease -     CT CARDIAC SCORING ([facility] LOCATIONS ONLY); Future  Other orders -     predniSONE (DELTASONE) 20 MG tablet; Take 1 tablet (20 mg total) by mouth daily with breakfast. -     benzonatate (TESSALON) 200 MG capsule; Take 1 capsule (200 mg total) by mouth 3 (three) times daily as needed for cough. -     albuterol (VENTOLIN HFA) 108 (90 Base) MCG/ACT inhaler; Inhale 2 puffs into the lungs every 6 (six) hours as needed for wheezing or shortness of breath. -     EPINEPHrine (EPIPEN 2-PAK) 0.3 mg/0.3 mL IJ SOAJ injection; Inject 0.3 mg into the muscle as needed.   Follow-up pending labs, yearly for physical

## 2022-05-14 ENCOUNTER — Other Ambulatory Visit: Payer: Self-pay | Admitting: Medical

## 2022-05-14 LAB — COMPREHENSIVE METABOLIC PANEL
ALT: 35 IU/L (ref 0–44)
AST: 23 IU/L (ref 0–40)
Albumin/Globulin Ratio: 2 (ref 1.2–2.2)
Albumin: 4.7 g/dL (ref 4.1–5.1)
Alkaline Phosphatase: 76 IU/L (ref 44–121)
BUN/Creatinine Ratio: 12 (ref 9–20)
BUN: 13 mg/dL (ref 6–20)
Bilirubin Total: 0.6 mg/dL (ref 0.0–1.2)
CO2: 22 mmol/L (ref 20–29)
Calcium: 9.9 mg/dL (ref 8.7–10.2)
Chloride: 103 mmol/L (ref 96–106)
Creatinine, Ser: 1.08 mg/dL (ref 0.76–1.27)
Globulin, Total: 2.3 g/dL (ref 1.5–4.5)
Glucose: 88 mg/dL (ref 70–99)
Potassium: 4.3 mmol/L (ref 3.5–5.2)
Sodium: 139 mmol/L (ref 134–144)
Total Protein: 7 g/dL (ref 6.0–8.5)
eGFR: 91 mL/min/{1.73_m2} (ref >59)

## 2022-05-14 LAB — IRON,TIBC AND FERRITIN PANEL
Ferritin: 93 ng/mL (ref 30–400)
Iron Saturation: 21 % (ref 15–55)
Iron: 62 ug/dL (ref 38–169)
Total Iron Binding Capacity: 300 ug/dL (ref 250–450)
UIBC: 238 ug/dL (ref 111–343)

## 2022-05-14 LAB — LIPID PANEL
Chol/HDL Ratio: 5.2 ratio — ABNORMAL HIGH (ref 0.0–5.0)
Cholesterol, Total: 204 mg/dL — ABNORMAL HIGH (ref 100–199)
HDL: 39 mg/dL — ABNORMAL LOW (ref >39)
LDL Chol Calc (NIH): 137 mg/dL — ABNORMAL HIGH (ref 0–99)
Triglycerides: 153 mg/dL — ABNORMAL HIGH (ref 0–149)
VLDL Cholesterol Cal: 28 mg/dL (ref 5–40)

## 2022-05-14 LAB — HEMOGLOBIN A1C
Est. average glucose Bld gHb Est-mCnc: 111 mg/dL
Hgb A1c MFr Bld: 5.5 % (ref 4.8–5.6)

## 2022-05-14 LAB — CBC
Hematocrit: 43.6 % (ref 37.5–51.0)
Hemoglobin: 15.3 g/dL (ref 13.0–17.7)
MCH: 29.4 pg (ref 26.6–33.0)
MCHC: 35.1 g/dL (ref 31.5–35.7)
MCV: 84 fL (ref 79–97)
Platelets: 234 10*3/uL (ref 150–450)
RBC: 5.21 x10E6/uL (ref 4.14–5.80)
RDW: 13.6 % (ref 11.6–15.4)
WBC: 5.6 10*3/uL (ref 3.4–10.8)

## 2022-05-14 MED ORDER — ROSUVASTATIN CALCIUM 20 MG PO TABS: 20.0000 mg | ORAL_TABLET | Freq: Every day | ORAL | 3 refills | Status: DC

## 2022-06-02 ENCOUNTER — Telehealth: Payer: Self-pay | Admitting: Medical

## 2022-06-02 NOTE — Telephone Encounter (Signed)
I received an insurance denial for CT coronary heart test.  This was supposed to be a $95 cash pay test without being billed to insurance.  This is usually how they do it over there.  He may need to call [facility] imaging to work this out.  This should have been a cash pay test in general.

## 2022-06-02 NOTE — Telephone Encounter (Signed)
Pt was notified and is aware of this

## 2022-06-06 ENCOUNTER — Other Ambulatory Visit: Payer: Self-pay | Admitting: Medical

## 2022-06-07 ENCOUNTER — Other Ambulatory Visit: Payer: Self-pay | Admitting: Medical

## 2022-06-29 ENCOUNTER — Ambulatory Visit: Payer: No Typology Code available for payment source

## 2022-06-29 ENCOUNTER — Encounter: Payer: Self-pay | Admitting: Hematology

## 2022-06-29 ENCOUNTER — Other Ambulatory Visit: Payer: Self-pay | Admitting: Medical

## 2022-06-29 NOTE — Telephone Encounter (Signed)
Dose increased.

## 2022-07-27 ENCOUNTER — Other Ambulatory Visit: Payer: Self-pay | Admitting: Medical

## 2022-07-27 DIAGNOSIS — Z136 Encounter for screening for cardiovascular disorders: Secondary | ICD-10-CM

## 2022-07-27 DIAGNOSIS — Z8249 Family history of ischemic heart disease and other diseases of the circulatory system: Secondary | ICD-10-CM

## 2022-07-27 DIAGNOSIS — E785 Hyperlipidemia, unspecified: Secondary | ICD-10-CM

## 2022-07-30 ENCOUNTER — Other Ambulatory Visit: Payer: No Typology Code available for payment source

## 2022-08-23 ENCOUNTER — Ambulatory Visit
Admission: RE | Admit: 2022-08-23 | Discharge: 2022-08-23 | Disposition: A | Discharge status: Home / Self Care | Payer: No Typology Code available for payment source | Source: Ambulatory Visit | Attending: Medical | Admitting: Medical

## 2022-08-23 DIAGNOSIS — Z8249 Family history of ischemic heart disease and other diseases of the circulatory system: Secondary | ICD-10-CM

## 2022-08-23 DIAGNOSIS — E785 Hyperlipidemia, unspecified: Secondary | ICD-10-CM

## 2022-08-23 DIAGNOSIS — Z136 Encounter for screening for cardiovascular disorders: Secondary | ICD-10-CM

## 2022-08-24 NOTE — Progress Notes (Signed)
Results sent through MyChart

## 2022-08-27 ENCOUNTER — Telehealth: Payer: Self-pay | Admitting: Medical

## 2022-08-27 MED ORDER — ROSUVASTATIN CALCIUM 20 MG PO TABS: 20.0000 mg | ORAL_TABLET | Freq: Every day | ORAL | 2 refills | Status: DC

## 2022-08-27 NOTE — Telephone Encounter (Signed)
Pt called and states he has moved and needs rx changed to a different pharmacy. Please change [facility] to [facility] on [address]. Pt does need to pick up. PT can be reached at [phone].

## 2022-08-27 NOTE — Telephone Encounter (Signed)
Sent med in 

## 2023-05-19 ENCOUNTER — Ambulatory Visit: Payer: BC Managed Care – PPO | Admitting: Medical

## 2023-05-19 ENCOUNTER — Encounter: Payer: Self-pay | Admitting: Medical

## 2023-05-19 VITALS — BP 128/84 | HR 78 | Ht 72.5 in | Wt 284.4 lb

## 2023-05-19 DIAGNOSIS — E785 Hyperlipidemia, unspecified: Secondary | ICD-10-CM | POA: Diagnosis not present

## 2023-05-19 DIAGNOSIS — Z8349 Family history of other endocrine, nutritional and metabolic diseases: Secondary | ICD-10-CM | POA: Diagnosis not present

## 2023-05-19 DIAGNOSIS — Z0000 Encounter for general adult medical examination without abnormal findings: Secondary | ICD-10-CM

## 2023-05-19 DIAGNOSIS — Z6838 Body mass index (BMI) 38.0-38.9, adult: Secondary | ICD-10-CM | POA: Insufficient documentation

## 2023-05-19 DIAGNOSIS — Z23 Encounter for immunization: Secondary | ICD-10-CM

## 2023-05-19 DIAGNOSIS — R519 Headache, unspecified: Secondary | ICD-10-CM

## 2023-05-19 DIAGNOSIS — J4520 Mild intermittent asthma, uncomplicated: Secondary | ICD-10-CM

## 2023-05-19 DIAGNOSIS — Z7185 Encounter for immunization safety counseling: Secondary | ICD-10-CM

## 2023-05-19 DIAGNOSIS — Z91030 Bee allergy status: Secondary | ICD-10-CM

## 2023-05-19 MED ORDER — EPINEPHRINE 0.3 MG/0.3ML IJ SOAJ: 0.3000 mg | INTRAMUSCULAR | 2 refills | Status: DC | PRN

## 2023-05-19 MED ORDER — ALBUTEROL SULFATE HFA 108 (90 BASE) MCG/ACT IN AERS: 2.0000 | INHALATION_SPRAY | Freq: Four times a day (QID) | RESPIRATORY_TRACT | 2 refills | Status: AC | PRN

## 2023-05-19 NOTE — Progress Notes (Signed)
Subjective:   HPI  Michael Lucero is a 37 y.o. male who presents for Chief Complaint  Patient presents with   Annual Exam    CPE fasting labs, had some head aches for two in sept. T-dap shot today    Patient Care Team: Roderick Calo, Kermit Balo, PA-C as PCP - General (Family Medicine) Jeani Hawking, MD as Consulting Physician (Gastroenterology) Sees dentist Sees eye doctor  Concerns: Here for routine physical  Hemochromatosis-he continues to do blood donations every 3 months.  He was doing more often, but that was draining his energy.    Has had a few "stress" headaches in September.  Thought he could have been dehydrated.  Had some headaches on and off for 2 weeks, then a few after that.   Headaches were bitemporal.  Would last half a day or more sometimes.  No associated numbness, tingling, nausea, dizziness.    Reviewed their medical, surgical, family, social, medication, and allergy history and updated chart as appropriate.  Past Medical History:  Diagnosis Date   Allergy    Childhood asthma    Hemochromatosis 2016   Hyperlipidemia    Vision problem    wears contacts    Past Surgical History:  Procedure Laterality Date   SHOULDER SURGERY     x 3 surgeries, 2 for labral tear; right shoulder   WISDOM TOOTH EXTRACTION  01/2013    Family History  Problem Relation Age of Onset   Diabetes Father    Heart disease Father        CABG age 73yo   Cancer Father         neuroendocrine tumor of stomach   Other Father        died post surgery with organ failure after tumor removal   Hemochromatosis Mother        CYS282Y gene   Hyperlipidemia Mother    Hemochromatosis Maternal Uncle    Hemochromatosis Maternal Grandmother    Stroke Neg Hx    Hypertension Neg Hx      Current Outpatient Medications:    rosuvastatin (CRESTOR) 20 MG tablet, Take 1 tablet (20 mg total) by mouth daily., Disp: 90 tablet, Rfl: 2   albuterol (VENTOLIN HFA) 108 (90 Base) MCG/ACT inhaler, Inhale 2 puffs  into the lungs every 6 (six) hours as needed for wheezing or shortness of breath., Disp: 18 each, Rfl: 2   EPINEPHrine (EPIPEN 2-PAK) 0.3 mg/0.3 mL IJ SOAJ injection, Inject 0.3 mg into the muscle as needed., Disp: 1 each, Rfl: 2  Allergies  Allergen Reactions   Bee Venom Anaphylaxis    Review of Systems  Constitutional:  Negative for chills, fever, malaise/fatigue and weight loss.  HENT:  Negative for congestion, ear pain, hearing loss, sore throat and tinnitus.   Eyes:  Negative for blurred vision, pain and redness.  Respiratory:  Negative for cough, hemoptysis and shortness of breath.   Cardiovascular:  Negative for chest pain, palpitations, orthopnea, claudication and leg swelling.  Gastrointestinal:  Negative for abdominal pain, blood in stool, constipation, diarrhea, nausea and vomiting.  Genitourinary:  Negative for dysuria, flank pain, frequency, hematuria and urgency.  Musculoskeletal:  Negative for falls, joint pain and myalgias.  Skin:  Negative for itching and rash.  Neurological:  Positive for headaches. Negative for dizziness, tingling, speech change and weakness.  Endo/Heme/Allergies:  Negative for polydipsia. Does not bruise/bleed easily.  Psychiatric/Behavioral:  Negative for depression and memory loss. The patient is not nervous/anxious and does not have insomnia.  05/19/2023    8:18 AM 05/13/2022   11:47 AM 01/14/2021   12:53 PM 03/14/2020    8:40 AM 12/21/2017    2:31 PM  Depression screen PHQ 2/9  Decreased Interest 0 0 0 0 0  Down, Depressed, Hopeless 0 0 0 0 0  PHQ - 2 Score 0 0 0 0 0        Objective:  BP 128/84   Pulse 78   Ht 6' 0.5" (1.842 m)   Wt 284 lb 6.4 oz (129 kg)   BMI 38.04 kg/m   Wt Readings from Last 3 Encounters:  05/19/23 284 lb 6.4 oz (129 kg)  05/13/22 274 lb (124.3 kg)  01/14/21 275 lb (124.7 kg)    General appearance: alert, no distress, WD/WN, Caucasian male Skin: unremarkable HEENT: normocephalic, conjunctiva/corneas  normal, sclerae anicteric, PERRLA, EOMi, nares patent, no discharge or erythema, pharynx normal Oral cavity: MMM, tongue normal, teeth normal Neck: supple, no lymphadenopathy, no thyromegaly, no masses, normal ROM, no bruits Chest: non tender, normal shape and expansion Heart: RRR, normal S1, S2, no murmurs Lungs: CTA bilaterally, no wheezes, rhonchi, or rales Abdomen: +bs, soft, non tender, non distended, no masses, no hepatomegaly, no splenomegaly, no bruits Back: non tender, normal ROM, no scoliosis Musculoskeletal: upper extremities non tender, no obvious deformity, normal ROM throughout, lower extremities non tender, no obvious deformity, normal ROM throughout Extremities: no edema, no cyanosis, no clubbing Pulses: 2+ symmetric, upper and lower extremities, normal cap refill Neurological: alert, oriented x 3, CN2-12 intact, strength normal upper extremities and lower extremities, sensation normal throughout, DTRs 2+ throughout, no cerebellar signs, gait normal Psychiatric: normal affect, behavior normal, pleasant  GU: normal male external genitalia,circumcised, nontender, no masses, no hernia, no lymphadenopathy Rectal: deferred   Assessment and Plan :   Encounter Diagnoses  Name Primary?   Encounter for health maintenance examination in adult Yes   Dyslipidemia    Bee allergy status    Family history of hemochromatosis    Hereditary hemochromatosis (HCC)    Mild intermittent asthma, unspecified whether complicated    Vaccine counseling    BMI 38.0-38.9,adult    Need for Tdap vaccination    Nonintractable headache, unspecified chronicity pattern, unspecified headache type      This visit was a preventative care visit, also known as wellness visit or routine physical.   Topics typically include healthy lifestyle, diet, exercise, preventative care, vaccinations, sick and well care, proper use of emergency dept and after hours care, as well as other concerns.      Recommendations: Continue to return yearly for your annual wellness and preventative care visits.  This gives [place] a chance to discuss healthy lifestyle, exercise, vaccinations, review your chart record, and perform screenings where appropriate.  I recommend you see your eye doctor yearly for routine vision care.  I recommend you see your dentist yearly for routine dental care including hygiene visits twice yearly.   Vaccination recommendations were reviewed Immunization History  Administered Date(s) Administered   DTaP 02/15/1985, 04/14/1985, 06/16/1985, 04/20/1988, 11/22/1989   HIB (PRP-OMP) 09/21/1986   Hepatitis B 02/06/1999, 04/20/1999, 02/19/2000, 12/10/2011, 01/14/2012, 05/23/2012   Influenza,inj,Quad PF,6+ Mos 05/13/2022   MMR 12/15/1985, 11/12/1989   OPV 01/18/1985, 04/14/1985, 06/16/1985, 04/20/1988, 11/12/1989   PPD Test 12/08/2011, 02/04/2016   Td 02/06/1999   Tdap 01/19/2013, 05/19/2023    Counseled on the Tdap (tetanus, diptheria, and acellular pertussis) vaccine.  Vaccine information sheet given. Tdap vaccine given after consent obtained.  Declines flu shot  Screening for cancer: Colon cancer screening: Age 63  Testicular cancer screening You should do a monthly self testicular exam if you are between 75-38 years old  We discussed PSA, prostate exam, and prostate cancer screening risks/benefits.     Skin cancer screening: Check your skin regularly for new changes, growing lesions, or other lesions of concern Come in for evaluation if you have skin lesions of concern.  Lung cancer screening: If you have a greater than 20 pack year history of tobacco use, then you may qualify for lung cancer screening with a chest CT scan.   Please call your insurance company to inquire about coverage for this test.  We currently don't have screenings for other cancers besides breast, cervical, colon, and lung cancers.  If you have a strong family history of cancer or have  other cancer screening concerns, please let me know.    Bone health: Get at least 150 minutes of aerobic exercise weekly Get weight bearing exercise at least once weekly Bone density test:  A bone density test is an imaging test that uses a type of X-ray to measure the amount of calcium and other minerals in your bones. The test may be used to diagnose or screen you for a condition that causes weak or thin bones (osteoporosis), predict your risk for a broken bone (fracture), or determine how well your osteoporosis treatment is working. The bone density test is recommended for females 65 and older, or females or males <65 if certain risk factors such as thyroid disease, long term use of steroids such as for asthma or rheumatological issues, vitamin D deficiency, estrogen deficiency, family history of osteoporosis, self or family history of fragility fracture in first degree relative.    Heart health: Get at least 150 minutes of aerobic exercise weekly Limit alcohol It is important to maintain a healthy blood pressure and healthy cholesterol numbers  Heart disease screening: Screening for heart disease includes screening for blood pressure, fasting lipids, glucose/diabetes screening, BMI height to weight ratio, reviewed of smoking status, physical activity, and diet.    Goals include blood pressure 120/80 or less, maintaining a healthy lipid/cholesterol profile, preventing diabetes or keeping diabetes numbers under good control, not smoking or using tobacco products, exercising most days per week or at least 150 minutes per week of exercise, and eating healthy variety of fruits and vegetables, healthy oils, and avoiding unhealthy food choices like fried food, fast food, high sugar and high cholesterol foods.    Other tests may possibly include EKG test, CT coronary calcium score, echocardiogram, exercise treadmill stress test.   CT coronary 08/23/22: IMPRESSION: 1. Total calcium score of  15.1. 2. Small bilateral solid pulmonary nodules measuring 3 mm. No follow-up needed if patient is low-risk.This recommendation follows the consensus statement: Guidelines for Management of Incidental Pulmonary Nodules Detected on CT Images: From the Fleischner Society 2017; Radiology 2017; 284:228-243.    Medical care options: I recommend you continue to seek care here first for routine care.  We try really hard to have available appointments Monday through Friday daytime hours for sick visits, acute visits, and physicals.  Urgent care should be used for after hours and weekends for significant issues that cannot wait till the next day.  The emergency department should be used for significant potentially life-threatening emergencies.  The emergency department is expensive, can often have long wait times for less significant concerns, so try to utilize primary care, urgent care, or telemedicine when possible to avoid unnecessary  trips to the emergency department.  Virtual visits and telemedicine have been introduced since the pandemic started in 2020, and can be convenient ways to receive medical care.  We offer virtual appointments as well to assist you in a variety of options to seek medical care.   Separate significant issues discussed: Hemachromatosis - updated labs today.  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  He has gained some weight since last year.  Headaches-discussed possible cause of headaches including common things such as excess caffeine use, stress, dehydration but also other potential things such as sleep apnea.  Advised he requires to lose weight, do not skip meals, hydrate well.  Can use over-the-counter analgesic as needed.  Labs today for further evaluation.  Fox was seen today for annual  exam.  Diagnoses and all orders for this visit:  Encounter for health maintenance examination in adult -     Comprehensive metabolic panel -     CBC with Differential/Platelet -     Lipid panel -     Hemoglobin A1c -     TSH -     Iron, TIBC and Ferritin Panel  Dyslipidemia -     Comprehensive metabolic panel -     Lipid panel  Bee allergy status  Family history of hemochromatosis  Hereditary hemochromatosis (HCC) -     CBC with Differential/Platelet -     Iron, TIBC and Ferritin Panel  Mild intermittent asthma, unspecified whether complicated  Vaccine counseling  BMI 38.0-38.9,adult  Need for Tdap vaccination -     Tdap vaccine greater than or equal to 7yo IM  Nonintractable headache, unspecified chronicity pattern, unspecified headache type  Other orders -     albuterol (VENTOLIN HFA) 108 (90 Base) MCG/ACT inhaler; Inhale 2 puffs into the lungs every 6 (six) hours as needed for wheezing or shortness of breath. -     EPINEPHrine (EPIPEN 2-PAK) 0.3 mg/0.3 mL IJ SOAJ injection; Inject 0.3 mg into the muscle as needed.    Follow-up pending labs, yearly for physical

## 2023-05-20 ENCOUNTER — Other Ambulatory Visit: Payer: Self-pay | Admitting: Medical

## 2023-05-20 LAB — IRON,TIBC AND FERRITIN PANEL
Ferritin: 80 ng/mL (ref 30–400)
Iron Saturation: 48 % (ref 15–55)
Iron: 135 ug/dL (ref 38–169)
Total Iron Binding Capacity: 281 ug/dL (ref 250–450)
UIBC: 146 ug/dL (ref 111–343)

## 2023-05-20 LAB — COMPREHENSIVE METABOLIC PANEL
ALT: 39 [IU]/L (ref 0–44)
AST: 21 [IU]/L (ref 0–40)
Albumin: 4.6 g/dL (ref 4.1–5.1)
Alkaline Phosphatase: 80 [IU]/L (ref 44–121)
BUN/Creatinine Ratio: 14 (ref 9–20)
BUN: 15 mg/dL (ref 6–20)
Bilirubin Total: 0.7 mg/dL (ref 0.0–1.2)
CO2: 21 mmol/L (ref 20–29)
Calcium: 9.5 mg/dL (ref 8.7–10.2)
Chloride: 102 mmol/L (ref 96–106)
Creatinine, Ser: 1.04 mg/dL (ref 0.76–1.27)
Globulin, Total: 2.5 g/dL (ref 1.5–4.5)
Glucose: 99 mg/dL (ref 70–99)
Potassium: 4.5 mmol/L (ref 3.5–5.2)
Sodium: 138 mmol/L (ref 134–144)
Total Protein: 7.1 g/dL (ref 6.0–8.5)
eGFR: 94 mL/min/{1.73_m2} (ref >59)

## 2023-05-20 LAB — CBC WITH DIFFERENTIAL/PLATELET
Basophils Absolute: 0 10*3/uL (ref 0.0–0.2)
Basos: 1 %
EOS (ABSOLUTE): 0.2 10*3/uL (ref 0.0–0.4)
Eos: 3 %
Hematocrit: 45.1 % (ref 37.5–51.0)
Hemoglobin: 15.4 g/dL (ref 13.0–17.7)
Immature Grans (Abs): 0 10*3/uL (ref 0.0–0.1)
Immature Granulocytes: 0 %
Lymphocytes Absolute: 1.7 10*3/uL (ref 0.7–3.1)
Lymphs: 32 %
MCH: 29.4 pg (ref 26.6–33.0)
MCHC: 34.1 g/dL (ref 31.5–35.7)
MCV: 86 fL (ref 79–97)
Monocytes Absolute: 0.4 10*3/uL (ref 0.1–0.9)
Monocytes: 8 %
Neutrophils Absolute: 3.1 10*3/uL (ref 1.4–7.0)
Neutrophils: 56 %
Platelets: 237 10*3/uL (ref 150–450)
RBC: 5.24 x10E6/uL (ref 4.14–5.80)
RDW: 16.3 % — ABNORMAL HIGH (ref 11.6–15.4)
WBC: 5.5 10*3/uL (ref 3.4–10.8)

## 2023-05-20 LAB — HEMOGLOBIN A1C
Est. average glucose Bld gHb Est-mCnc: 111 mg/dL
Hgb A1c MFr Bld: 5.5 % (ref 4.8–5.6)

## 2023-05-20 LAB — LIPID PANEL
Chol/HDL Ratio: 5.3 ratio — ABNORMAL HIGH (ref 0.0–5.0)
Cholesterol, Total: 217 mg/dL — ABNORMAL HIGH (ref 100–199)
HDL: 41 mg/dL (ref >39)
LDL Chol Calc (NIH): 137 mg/dL — ABNORMAL HIGH (ref 0–99)
Triglycerides: 216 mg/dL — ABNORMAL HIGH (ref 0–149)
VLDL Cholesterol Cal: 39 mg/dL (ref 5–40)

## 2023-05-20 LAB — TSH: TSH: 2.63 u[IU]/mL (ref 0.450–4.500)

## 2023-05-20 MED ORDER — ROSUVASTATIN CALCIUM 20 MG PO TABS: 20.0000 mg | ORAL_TABLET | Freq: Every day | ORAL | 3 refills | Status: DC

## 2023-05-20 NOTE — Progress Notes (Signed)
Results sent through MyChart

## 2023-07-11 ENCOUNTER — Encounter: Payer: Self-pay | Admitting: Hematology

## 2023-07-16 ENCOUNTER — Ambulatory Visit: Payer: No Typology Code available for payment source

## 2024-05-18 ENCOUNTER — Other Ambulatory Visit: Payer: Self-pay | Admitting: Medical

## 2024-05-18 NOTE — Telephone Encounter (Signed)
 Patient has upcoming appt soon

## 2024-05-24 ENCOUNTER — Encounter: Payer: Self-pay | Admitting: Medical

## 2024-05-24 ENCOUNTER — Encounter: Payer: Self-pay | Admitting: Hematology

## 2024-05-24 ENCOUNTER — Ambulatory Visit: Payer: BC Managed Care – PPO | Admitting: Medical

## 2024-05-24 VITALS — BP 120/80 | HR 64 | Ht 73.0 in | Wt 279.0 lb

## 2024-05-24 DIAGNOSIS — Z0000 Encounter for general adult medical examination without abnormal findings: Secondary | ICD-10-CM

## 2024-05-24 DIAGNOSIS — Z2820 Immunization not carried out because of patient decision for unspecified reason: Secondary | ICD-10-CM | POA: Diagnosis not present

## 2024-05-24 DIAGNOSIS — Z7185 Encounter for immunization safety counseling: Secondary | ICD-10-CM

## 2024-05-24 DIAGNOSIS — J4520 Mild intermittent asthma, uncomplicated: Secondary | ICD-10-CM

## 2024-05-24 DIAGNOSIS — Z6836 Body mass index (BMI) 36.0-36.9, adult: Secondary | ICD-10-CM

## 2024-05-24 DIAGNOSIS — Z131 Encounter for screening for diabetes mellitus: Secondary | ICD-10-CM

## 2024-05-24 DIAGNOSIS — Z91030 Bee allergy status: Secondary | ICD-10-CM | POA: Diagnosis not present

## 2024-05-24 DIAGNOSIS — E785 Hyperlipidemia, unspecified: Secondary | ICD-10-CM

## 2024-05-24 MED ORDER — EPINEPHRINE 0.3 MG/0.3ML IJ SOAJ: 0.3000 mg | INTRAMUSCULAR | 2 refills | Status: AC | PRN

## 2024-05-24 NOTE — Progress Notes (Signed)
 Subjective:   HPI  Michael Lucero is a 38 y.o. male who presents for Chief Complaint  Patient presents with   Annual Exam    Fasting cpe, no concerns, declines flu and cover    Patient Care Team: Olegario Emberson, Alm RAMAN, PA-C as PCP - General (Family Medicine) Rollin Dover, MD as Consulting Physician (Gastroenterology) Sees dentist Sees eye doctor  Concerns: Here for routine physical  Hemochromatosis-he continues to do blood donations every 2-3 months.    Hyperlipidemia-compliant with Crestor  20 mg daily  Working with nutritionist.  Logging meals, trying to work on increasing protein.   Been doing this about a month.     Reviewed their medical, surgical, family, social, medication, and allergy history and updated chart as appropriate.  Past Medical History:  Diagnosis Date   Allergy    Childhood asthma    Hemochromatosis 2016   Hyperlipidemia    Vision problem    wears contacts    Past Surgical History:  Procedure Laterality Date   SHOULDER SURGERY     x 3 surgeries, 2 for labral tear; right shoulder   WISDOM TOOTH EXTRACTION  01/2013    Family History  Problem Relation Age of Onset   Diabetes Father    Heart disease Father        CABG age 64yo   Cancer Father         neuroendocrine tumor of stomach   Other Father        died post surgery with organ failure after tumor removal   Hemochromatosis Mother        CYS282Y gene   Hyperlipidemia Mother    Hemochromatosis Maternal Uncle    Hemochromatosis Maternal Grandmother    Stroke Neg Hx    Hypertension Neg Hx      Current Outpatient Medications:    albuterol  (VENTOLIN  HFA) 108 (90 Base) MCG/ACT inhaler, Inhale 2 puffs into the lungs every 6 (six) hours as needed for wheezing or shortness of breath., Disp: 18 each, Rfl: 2   rosuvastatin  (CRESTOR ) 20 MG tablet, TAKE 1 TABLET(20 MG) BY MOUTH DAILY, Disp: 30 tablet, Rfl: 0   EPINEPHrine  (EPIPEN  2-PAK) 0.3 mg/0.3 mL IJ SOAJ injection, Inject 0.3 mg into the  muscle as needed., Disp: 1 each, Rfl: 2  Allergies  Allergen Reactions   Bee Venom Anaphylaxis    Review of Systems  Constitutional:  Negative for chills, fever, malaise/fatigue and weight loss.  HENT:  Negative for congestion, ear pain, hearing loss, sore throat and tinnitus.   Eyes:  Negative for blurred vision, pain and redness.  Respiratory:  Negative for cough, hemoptysis and shortness of breath.   Cardiovascular:  Negative for chest pain, palpitations, orthopnea, claudication and leg swelling.  Gastrointestinal:  Negative for abdominal pain, blood in stool, constipation, diarrhea, nausea and vomiting.  Genitourinary:  Negative for dysuria, flank pain, frequency, hematuria and urgency.  Musculoskeletal:  Negative for falls, joint pain and myalgias.  Skin:  Negative for itching and rash.  Neurological:  Negative for dizziness, tingling, speech change, weakness and headaches.  Endo/Heme/Allergies:  Negative for polydipsia. Does not bruise/bleed easily.  Psychiatric/Behavioral:  Negative for depression and memory loss. The patient is not nervous/anxious and does not have insomnia.         05/24/2024    9:03 AM 05/19/2023    8:18 AM 05/13/2022   11:47 AM 01/14/2021   12:53 PM 03/14/2020    8:40 AM  Depression screen PHQ 2/9  Decreased Interest 0 0 0  0 0  Down, Depressed, Hopeless 0 0 0 0 0  PHQ - 2 Score 0 0 0 0 0        Objective:  BP 120/80   Pulse 64   Ht 6' 1 (1.854 m)   Wt 279 lb (126.6 kg)   SpO2 98%   BMI 36.81 kg/m   Wt Readings from Last 3 Encounters:  05/24/24 279 lb (126.6 kg)  05/19/23 284 lb 6.4 oz (129 kg)  05/13/22 274 lb (124.3 kg)   General appearance: alert, no distress, WD/WN, Caucasian male Skin: unremarkable HEENT: normocephalic, conjunctiva/corneas normal, sclerae anicteric, PERRLA, EOMi, nares patent, no discharge or erythema, pharynx normal Oral cavity: MMM, tongue normal, teeth normal Neck: supple, no lymphadenopathy, no thyromegaly, no  masses, normal ROM, no bruits Chest: non tender, normal shape and expansion Heart: RRR, normal S1, S2, no murmurs Lungs: CTA bilaterally, no wheezes, rhonchi, or rales Abdomen: +bs, soft, non tender, non distended, no masses, no hepatomegaly, no splenomegaly, no bruits Back: non tender, normal ROM, no scoliosis Musculoskeletal: upper extremities non tender, no obvious deformity, normal ROM throughout, lower extremities non tender, no obvious deformity, normal ROM throughout Extremities: no edema, no cyanosis, no clubbing Pulses: 2+ symmetric, upper and lower extremities, normal cap refill Neurological: alert, oriented x 3, CN2-12 intact, strength normal upper extremities and lower extremities, sensation normal throughout, DTRs 2+ throughout, no cerebellar signs, gait normal Psychiatric: normal affect, behavior normal, pleasant  GU: normal male external genitalia,circumcised, nontender, no masses, no hernia, no lymphadenopathy Rectal: deferred   Assessment and Plan :   Encounter Diagnoses  Name Primary?   Vaccine refused by patient Yes   Encounter for health maintenance examination in adult    Bee allergy status    Dyslipidemia    Hereditary hemochromatosis    Mild intermittent asthma, unspecified whether complicated    Vaccine counseling    Screening for diabetes mellitus    BMI 36.0-36.9,adult     This visit was a preventative care visit, also known as wellness visit or routine physical.   Topics typically include healthy lifestyle, diet, exercise, preventative care, vaccinations, sick and well care, proper use of emergency dept and after hours care, as well as other concerns.     Recommendations: Continue to return yearly for your annual wellness and preventative care visits.  This gives us  a chance to discuss healthy lifestyle, exercise, vaccinations, review your chart record, and perform screenings where appropriate.  I recommend you see your eye doctor yearly for routine  vision care.  I recommend you see your dentist yearly for routine dental care including hygiene visits twice yearly.   Vaccination recommendations were reviewed Immunization History  Administered Date(s) Administered   DTaP 02/15/1985, 04/14/1985, 06/16/1985, 04/20/1988, 11/22/1989   HIB (PRP-OMP) 09/21/1986   Hepatitis B 02/06/1999, 04/20/1999, 02/19/2000, 12/10/2011, 01/14/2012, 05/23/2012   Influenza,inj,Quad PF,6+ Mos 05/13/2022   MMR 12/15/1985, 11/12/1989   OPV 01/18/1985, 04/14/1985, 06/16/1985, 04/20/1988, 11/12/1989   PPD Test 12/08/2011, 02/04/2016   Td 02/06/1999   Tdap 01/19/2013, 05/19/2023    Counseled on vaccines, he declines vaccines today   Screening for cancer: Colon cancer screening: Age 75  Testicular cancer screening You should do a monthly self testicular exam if you are between 61-24 years old  We discussed PSA, prostate exam, and prostate cancer screening risks/benefits.     Skin cancer screening: Check your skin regularly for new changes, growing lesions, or other lesions of concern Come in for evaluation if you have skin lesions  of concern.  Lung cancer screening: If you have a greater than 20 pack year history of tobacco use, then you may qualify for lung cancer screening with a chest CT scan.   Please call your insurance company to inquire about coverage for this test.  We currently don't have screenings for other cancers besides breast, cervical, colon, and lung cancers.  If you have a strong family history of cancer or have other cancer screening concerns, please let me know.    Bone health: Get at least 150 minutes of aerobic exercise weekly Get weight bearing exercise at least once weekly Bone density test:  A bone density test is an imaging test that uses a type of X-ray to measure the amount of calcium  and other minerals in your bones. The test may be used to diagnose or screen you for a condition that causes weak or thin bones  (osteoporosis), predict your risk for a broken bone (fracture), or determine how well your osteoporosis treatment is working. The bone density test is recommended for females 65 and older, or females or males <65 if certain risk factors such as thyroid disease, long term use of steroids such as for asthma or rheumatological issues, vitamin D deficiency, estrogen deficiency, family history of osteoporosis, self or family history of fragility fracture in first degree relative.    Heart health: Get at least 150 minutes of aerobic exercise weekly Limit alcohol It is important to maintain a healthy blood pressure and healthy cholesterol numbers  Heart disease screening: Screening for heart disease includes screening for blood pressure, fasting lipids, glucose/diabetes screening, BMI height to weight ratio, reviewed of smoking status, physical activity, and diet.    Goals include blood pressure 120/80 or less, maintaining a healthy lipid/cholesterol profile, preventing diabetes or keeping diabetes numbers under good control, not smoking or using tobacco products, exercising most days per week or at least 150 minutes per week of exercise, and eating healthy variety of fruits and vegetables, healthy oils, and avoiding unhealthy food choices like fried food, fast food, high sugar and high cholesterol foods.    Other tests may possibly include EKG test, CT coronary calcium  score, echocardiogram, exercise treadmill stress test.   CT coronary 08/23/22: IMPRESSION: 1. Total calcium  score of 15.1. 2. Small bilateral solid pulmonary nodules measuring 3 mm. No follow-up needed if patient is low-risk.This recommendation follows the consensus statement: Guidelines for Management of Incidental Pulmonary Nodules Detected on CT Images: From the Fleischner Society 2017; Radiology 2017; 284:228-243.    Medical care options: I recommend you continue to seek care here first for routine care.  We try really hard  to have available appointments Monday through Friday daytime hours for sick visits, acute visits, and physicals.  Urgent care should be used for after hours and weekends for significant issues that cannot wait till the next day.  The emergency department should be used for significant potentially life-threatening emergencies.  The emergency department is expensive, can often have long wait times for less significant concerns, so try to utilize primary care, urgent care, or telemedicine when possible to avoid unnecessary trips to the emergency department.  Virtual visits and telemedicine have been introduced since the pandemic started in 2020, and can be convenient ways to receive medical care.  We offer virtual appointments as well to assist you in a variety of options to seek medical care.   Separate significant issues discussed: Hemachromatosis - updated labs today.  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  He has gained some weight since last year.   Michae was seen today for annual exam.  Diagnoses and all orders for this visit:  Vaccine refused by patient  Encounter for health maintenance examination in adult -     CBC -     Comprehensive metabolic panel with GFR -     Lipid panel -     Hemoglobin A1c -     Urinalysis, Routine w reflex microscopic -     Iron  Bee allergy status  Dyslipidemia -     Lipid panel  Hereditary hemochromatosis -     Iron  Mild intermittent asthma, unspecified whether complicated  Vaccine counseling  Screening for diabetes mellitus -     Hemoglobin A1c  BMI 36.0-36.9,adult  Other orders -     EPINEPHrine  (EPIPEN  2-PAK) 0.3 mg/0.3 mL IJ SOAJ injection; Inject 0.3 mg into the muscle as needed.    Follow-up pending labs, yearly for physical

## 2024-05-25 ENCOUNTER — Ambulatory Visit: Payer: Self-pay | Admitting: Medical

## 2024-05-25 ENCOUNTER — Other Ambulatory Visit: Payer: Self-pay | Admitting: Medical

## 2024-05-25 LAB — CBC
Hematocrit: 45.1 % (ref 37.5–51.0)
Hemoglobin: 15.2 g/dL (ref 13.0–17.7)
MCH: 29.3 pg (ref 26.6–33.0)
MCHC: 33.7 g/dL (ref 31.5–35.7)
MCV: 87 fL (ref 79–97)
Platelets: 232 x10E3/uL (ref 150–450)
RBC: 5.18 x10E6/uL (ref 4.14–5.80)
RDW: 13.5 % (ref 11.6–15.4)
WBC: 5.7 x10E3/uL (ref 3.4–10.8)

## 2024-05-25 LAB — COMPREHENSIVE METABOLIC PANEL WITH GFR
ALT: 34 IU/L (ref 0–44)
AST: 22 IU/L (ref 0–40)
Albumin: 4.6 g/dL (ref 4.1–5.1)
Alkaline Phosphatase: 80 IU/L (ref 47–123)
BUN/Creatinine Ratio: 14 (ref 9–20)
BUN: 16 mg/dL (ref 6–20)
Bilirubin Total: 0.6 mg/dL (ref 0.0–1.2)
CO2: 19 mmol/L — ABNORMAL LOW (ref 20–29)
Calcium: 9.5 mg/dL (ref 8.7–10.2)
Chloride: 104 mmol/L (ref 96–106)
Creatinine, Ser: 1.14 mg/dL (ref 0.76–1.27)
Globulin, Total: 2.4 g/dL (ref 1.5–4.5)
Glucose: 94 mg/dL (ref 70–99)
Potassium: 4.5 mmol/L (ref 3.5–5.2)
Sodium: 136 mmol/L (ref 134–144)
Total Protein: 7 g/dL (ref 6.0–8.5)
eGFR: 84 mL/min/1.73 (ref >59)

## 2024-05-25 LAB — URINALYSIS, ROUTINE W REFLEX MICROSCOPIC
Bilirubin, UA: NEGATIVE
Glucose, UA: NEGATIVE
Ketones, UA: NEGATIVE
Leukocytes,UA: NEGATIVE
Nitrite, UA: NEGATIVE
Protein,UA: NEGATIVE
RBC, UA: NEGATIVE
Specific Gravity, UA: 1.014 (ref 1.005–1.030)
Urobilinogen, Ur: 0.2 mg/dL (ref 0.2–1.0)
pH, UA: 5.5 (ref 5.0–7.5)

## 2024-05-25 LAB — LIPID PANEL
Chol/HDL Ratio: 4.9 ratio (ref 0.0–5.0)
Cholesterol, Total: 209 mg/dL — ABNORMAL HIGH (ref 100–199)
HDL: 43 mg/dL (ref >39)
LDL Chol Calc (NIH): 135 mg/dL — ABNORMAL HIGH (ref 0–99)
Triglycerides: 170 mg/dL — ABNORMAL HIGH (ref 0–149)
VLDL Cholesterol Cal: 31 mg/dL (ref 5–40)

## 2024-05-25 LAB — IRON: Iron: 117 ug/dL (ref 38–169)

## 2024-05-25 LAB — HEMOGLOBIN A1C
Est. average glucose Bld gHb Est-mCnc: 103 mg/dL
Hgb A1c MFr Bld: 5.2 % (ref 4.8–5.6)

## 2024-05-25 MED ORDER — ROSUVASTATIN CALCIUM 20 MG PO TABS: 20.0000 mg | ORAL_TABLET | Freq: Every day | ORAL | 3 refills | Status: AC

## 2024-05-25 NOTE — Progress Notes (Signed)
 Results through MyChart

## 2025-06-05 ENCOUNTER — Encounter: Admitting: Medical
